# Patient Record
Sex: Male | Born: 1965 | Race: Black or African American | Hispanic: No | Marital: Single | Smoking: Never smoker
Health system: Southern US, Community
[De-identification: ages and names within clinical notes are randomized; demographics above are authoritative.]

## PROBLEM LIST (undated history)

## (undated) DIAGNOSIS — C801 Malignant (primary) neoplasm, unspecified: Secondary | ICD-10-CM

## (undated) DIAGNOSIS — E785 Hyperlipidemia, unspecified: Secondary | ICD-10-CM

## (undated) DIAGNOSIS — K7290 Hepatic failure, unspecified without coma: Secondary | ICD-10-CM

## (undated) HISTORY — PX: OTHER SURGICAL HISTORY: SHX169

---

## 2005-05-04 ENCOUNTER — Emergency Department: Payer: Self-pay | Admitting: Emergency Medicine

## 2007-05-06 ENCOUNTER — Emergency Department: Payer: Self-pay | Admitting: Internal Medicine

## 2017-07-13 ENCOUNTER — Inpatient Hospital Stay
Admission: EM | Admit: 2017-07-13 | Discharge: 2017-07-17 | DRG: 378 | Disposition: A | Discharge status: Home / Self Care | Payer: BLUE CROSS/BLUE SHIELD | Attending: Internal Medicine | Admitting: Internal Medicine

## 2017-07-13 ENCOUNTER — Other Ambulatory Visit: Payer: Self-pay

## 2017-07-13 ENCOUNTER — Encounter: Payer: Self-pay | Admitting: Emergency Medicine

## 2017-07-13 ENCOUNTER — Emergency Department: Payer: BLUE CROSS/BLUE SHIELD

## 2017-07-13 DIAGNOSIS — K921 Melena: Principal | ICD-10-CM | POA: Diagnosis present

## 2017-07-13 DIAGNOSIS — R933 Abnormal findings on diagnostic imaging of other parts of digestive tract: Secondary | ICD-10-CM | POA: Diagnosis not present

## 2017-07-13 DIAGNOSIS — Z79899 Other long term (current) drug therapy: Secondary | ICD-10-CM

## 2017-07-13 DIAGNOSIS — N400 Enlarged prostate without lower urinary tract symptoms: Secondary | ICD-10-CM | POA: Diagnosis present

## 2017-07-13 DIAGNOSIS — Z885 Allergy status to narcotic agent status: Secondary | ICD-10-CM | POA: Diagnosis not present

## 2017-07-13 DIAGNOSIS — C801 Malignant (primary) neoplasm, unspecified: Secondary | ICD-10-CM | POA: Diagnosis not present

## 2017-07-13 DIAGNOSIS — Z809 Family history of malignant neoplasm, unspecified: Secondary | ICD-10-CM | POA: Diagnosis not present

## 2017-07-13 DIAGNOSIS — K317 Polyp of stomach and duodenum: Secondary | ICD-10-CM | POA: Diagnosis present

## 2017-07-13 DIAGNOSIS — D125 Benign neoplasm of sigmoid colon: Secondary | ICD-10-CM | POA: Diagnosis present

## 2017-07-13 DIAGNOSIS — R160 Hepatomegaly, not elsewhere classified: Secondary | ICD-10-CM | POA: Diagnosis present

## 2017-07-13 DIAGNOSIS — R634 Abnormal weight loss: Secondary | ICD-10-CM | POA: Diagnosis present

## 2017-07-13 DIAGNOSIS — D490 Neoplasm of unspecified behavior of digestive system: Secondary | ICD-10-CM

## 2017-07-13 DIAGNOSIS — D500 Iron deficiency anemia secondary to blood loss (chronic): Secondary | ICD-10-CM | POA: Diagnosis present

## 2017-07-13 DIAGNOSIS — E785 Hyperlipidemia, unspecified: Secondary | ICD-10-CM | POA: Diagnosis present

## 2017-07-13 DIAGNOSIS — Z6834 Body mass index (BMI) 34.0-34.9, adult: Secondary | ICD-10-CM | POA: Diagnosis not present

## 2017-07-13 DIAGNOSIS — Z807 Family history of other malignant neoplasms of lymphoid, hematopoietic and related tissues: Secondary | ICD-10-CM | POA: Diagnosis not present

## 2017-07-13 DIAGNOSIS — D62 Acute posthemorrhagic anemia: Secondary | ICD-10-CM | POA: Diagnosis present

## 2017-07-13 DIAGNOSIS — D649 Anemia, unspecified: Secondary | ICD-10-CM

## 2017-07-13 DIAGNOSIS — K922 Gastrointestinal hemorrhage, unspecified: Secondary | ICD-10-CM | POA: Diagnosis present

## 2017-07-13 DIAGNOSIS — C799 Secondary malignant neoplasm of unspecified site: Secondary | ICD-10-CM | POA: Diagnosis not present

## 2017-07-13 DIAGNOSIS — R932 Abnormal findings on diagnostic imaging of liver and biliary tract: Secondary | ICD-10-CM | POA: Diagnosis not present

## 2017-07-13 DIAGNOSIS — K635 Polyp of colon: Secondary | ICD-10-CM

## 2017-07-13 DIAGNOSIS — D509 Iron deficiency anemia, unspecified: Secondary | ICD-10-CM | POA: Diagnosis not present

## 2017-07-13 HISTORY — DX: Hyperlipidemia, unspecified: E78.5

## 2017-07-13 LAB — TROPONIN I

## 2017-07-13 LAB — BASIC METABOLIC PANEL
ANION GAP: 9 (ref 5–15)
BUN: 11 mg/dL (ref 6–20)
CALCIUM: 9 mg/dL (ref 8.9–10.3)
CO2: 22 mmol/L (ref 22–32)
Chloride: 104 mmol/L (ref 98–111)
Creatinine, Ser: 0.92 mg/dL (ref 0.61–1.24)
GFR calc non Af Amer: >60 mL/min (ref >60)
GLUCOSE: 92 mg/dL (ref 70–99)
Potassium: 3.8 mmol/L (ref 3.5–5.1)
Sodium: 135 mmol/L (ref 135–145)

## 2017-07-13 LAB — CBC
HCT: 29.8 % — ABNORMAL LOW (ref 40.0–52.0)
Hemoglobin: 9.8 g/dL — ABNORMAL LOW (ref 13.0–18.0)
MCH: 25.7 pg — AB (ref 26.0–34.0)
MCHC: 32.9 g/dL (ref 32.0–36.0)
MCV: 78.2 fL — AB (ref 80.0–100.0)
Platelets: 319 10*3/uL (ref 150–440)
RBC: 3.81 MIL/uL — AB (ref 4.40–5.90)
RDW: 18.2 % — ABNORMAL HIGH (ref 11.5–14.5)
WBC: 9.1 10*3/uL (ref 3.8–10.6)

## 2017-07-13 LAB — HEPATIC FUNCTION PANEL
ALK PHOS: 190 U/L — AB (ref 38–126)
ALT: 19 U/L (ref 0–44)
AST: 36 U/L (ref 15–41)
Albumin: 3.6 g/dL (ref 3.5–5.0)
BILIRUBIN TOTAL: 0.8 mg/dL (ref 0.3–1.2)
Bilirubin, Direct: 0.2 mg/dL (ref 0.0–0.2)
Indirect Bilirubin: 0.6 mg/dL (ref 0.3–0.9)
Total Protein: 8.3 g/dL — ABNORMAL HIGH (ref 6.5–8.1)

## 2017-07-13 LAB — LIPASE, BLOOD: Lipase: 32 U/L (ref 11–51)

## 2017-07-13 LAB — HEMOGLOBIN: HEMOGLOBIN: 9.9 g/dL — AB (ref 13.0–18.0)

## 2017-07-13 LAB — TYPE AND SCREEN
ABO/RH(D): B POS
Antibody Screen: NEGATIVE

## 2017-07-13 MED ORDER — SODIUM CHLORIDE 0.9 % IV SOLN
INTRAVENOUS | Status: DC
  Administered 2017-07-13 – 2017-07-14 (×2): via INTRAVENOUS

## 2017-07-13 MED ORDER — OXYCODONE HCL 5 MG PO TABS: 5.0000 mg | ORAL_TABLET | ORAL | Status: DC | PRN

## 2017-07-13 MED ORDER — ACETAMINOPHEN 650 MG RE SUPP: 650.0000 mg | Freq: Four times a day (QID) | RECTAL | Status: DC | PRN

## 2017-07-13 MED ORDER — ONDANSETRON HCL 4 MG/2ML IJ SOLN: 4.0000 mg | Freq: Four times a day (QID) | INTRAMUSCULAR | Status: DC | PRN

## 2017-07-13 MED ORDER — ACETAMINOPHEN 325 MG PO TABS
650.0000 mg | ORAL_TABLET | Freq: Four times a day (QID) | ORAL | Status: DC | PRN
  Administered 2017-07-13 – 2017-07-17 (×6): 650 mg via ORAL
  Filled 2017-07-13 (×6): qty 2

## 2017-07-13 MED ORDER — SODIUM CHLORIDE 0.9 % IV SOLN
8.0000 mg/h | INTRAVENOUS | Status: DC
  Administered 2017-07-14 – 2017-07-15 (×3): 8 mg/h via INTRAVENOUS
  Filled 2017-07-13 (×5): qty 80

## 2017-07-13 MED ORDER — PANTOPRAZOLE SODIUM 40 MG IV SOLR: 40.0000 mg | Freq: Two times a day (BID) | INTRAVENOUS | Status: DC

## 2017-07-13 MED ORDER — ONDANSETRON HCL 4 MG PO TABS: 4.0000 mg | ORAL_TABLET | Freq: Four times a day (QID) | ORAL | Status: DC | PRN

## 2017-07-13 MED ORDER — SODIUM CHLORIDE 0.9 % IV SOLN
80.0000 mg | Freq: Once | INTRAVENOUS | Status: AC
  Administered 2017-07-13: 80 mg via INTRAVENOUS
  Filled 2017-07-13: qty 80

## 2017-07-13 NOTE — ED Provider Notes (Signed)
[facility] Emergency Department Provider Note  ____________________________________________  Time seen: Approximately 4:18 PM  I have reviewed the triage vital signs and the nursing notes.   HISTORY  Chief Complaint Chest Pain   HPI Omar Young is a 52 y.o. male with a history of hyperlipidemia and anemia who presents for evaluation of chest pain.  Patient reports that the pain started yesterday at 8 PM, the pain is dull and pressure-like located in the center of his chest, constant, mild, nonradiating.  No shortness of breath, diaphoresis, nausea, vomiting, no abdominal pain, the pain does not radiate to the back.  Patient denies doing any strenuous exercise yesterday.  He denies any personal history of heart attacks.  His mother died of a heart attack in her mid 39s.  Patient reports that he does not smoke, does not drink, does not use NSAIDs, no drug use.  He denies melena or history of GI bleed, hematemesis, coffee-ground emesis.  He denies any dizziness.  The pain is not pleuritic in nature, no personal family history of blood clots, recent travel immobilization, leg pain or swelling, hemoptysis, exogenous hormones.  Patient's wife reports that he has had 30 pound weight loss over the last year.  No night sweats.  No history of cancer  Past Medical History:  Diagnosis Date  . Hyperlipemia     Patient Active Problem List   Diagnosis Date Noted  . GI bleed 07/13/2017    Past Surgical History:  Procedure Laterality Date  . none      Prior to Admission medications   Not on File    Allergies Codeine  Family History  Problem Relation Age of Onset  . Diabetes Mother   . Hypertension Father     Social History Social History   Tobacco Use  . Smoking status: Never Smoker  . Smokeless tobacco: Never Used  Substance Use Topics  . Alcohol use: Not Currently  . Drug use: Never    Review of Systems  Constitutional: Negative for fever. +  Unintentional weight loss Eyes: Negative for visual changes. ENT: Negative for sore throat. Neck: No neck pain  Cardiovascular: + chest pain. Respiratory: Negative for shortness of breath. Gastrointestinal: Negative for abdominal pain, vomiting or diarrhea. Genitourinary: Negative for dysuria. Musculoskeletal: Negative for back pain. Skin: Negative for rash. Neurological: Negative for headaches, weakness or numbness. Psych: No SI or HI  ____________________________________________   PHYSICAL EXAM:  VITAL SIGNS: ED Triage Vitals  Enc Vitals Group     BP 07/13/17 1333 136/72     Pulse Rate 07/13/17 1333 85     Resp 07/13/17 1333 16     Temp 07/13/17 1333 100 F (37.8 C)     Temp Source 07/13/17 1333 Oral     SpO2 07/13/17 1333 97 %     Weight 07/13/17 1331 240 lb (108.9 kg)     Height 07/13/17 1331 5\' 10"  (1.778 m)     Head Circumference --      Peak Flow --      Pain Score 07/13/17 1331 6     Pain Loc --      Pain Edu? --      Excl. in [place]? --     Constitutional: Alert and oriented. Well appearing and in no apparent distress. HEENT:      Head: Normocephalic and atraumatic.         Eyes: Conjunctivae are normal. Sclera is non-icteric.       Mouth/Throat:  Mucous membranes are moist.       Neck: Supple with no signs of meningismus. Cardiovascular: Regular rate and rhythm. No murmurs, gallops, or rubs. 2+ symmetrical distal pulses are present in all extremities. No JVD. Respiratory: Normal respiratory effort. Lungs are clear to auscultation bilaterally. No wheezes, crackles, or rhonchi.  Gastrointestinal: Soft, tenderness to palpation on the epigastric region, and non distended with positive bowel sounds. No rebound or guarding. Genitourinary: No CVA tenderness.  Rectal exam showing melena grossly guaiac positive. Musculoskeletal: Nontender with normal range of motion in all extremities. No edema, cyanosis, or erythema of extremities. Neurologic: Normal speech and language.  Face is symmetric. Moving all extremities. No gross focal neurologic deficits are appreciated. Skin: Skin is warm, dry and intact. No rash noted. Psychiatric: Mood and affect are normal. Speech and behavior are normal.  ____________________________________________   LABS (all labs ordered are listed, but only abnormal results are displayed)  Labs Reviewed  CBC - Abnormal; Notable for the following components:      Result Value   RBC 3.81 (*)    Hemoglobin 9.8 (*)    HCT 29.8 (*)    MCV 78.2 (*)    MCH 25.7 (*)    RDW 18.2 (*)    All other components within normal limits  HEPATIC FUNCTION PANEL - Abnormal; Notable for the following components:   Total Protein 8.3 (*)    Alkaline Phosphatase 190 (*)    All other components within normal limits  BASIC METABOLIC PANEL  TROPONIN I  LIPASE, BLOOD  HEMOGLOBIN  HEMOGLOBIN  TYPE AND SCREEN  TYPE AND SCREEN   ____________________________________________  EKG  ED ECG REPORT I, Rudene Re, the attending physician, personally viewed and interpreted this ECG.  Normal sinus rhythm, rate of 79, normal intervals, normal axis, no ST elevations or depressions.  No prior for comparison. ____________________________________________  RADIOLOGY  I have personally reviewed the images performed during this visit and I agree with the Radiologist's read.   Interpretation by Radiologist:  Dg Chest 2 View  Result Date: 07/13/2017 CLINICAL DATA:  Chest pain since last night. EXAM: CHEST - 2 VIEW COMPARISON:  None. FINDINGS: The heart size and mediastinal contours are within normal limits. Both lungs are clear. The visualized skeletal structures are unremarkable. IMPRESSION: No active cardiopulmonary disease. Electronically Signed   By: Abelardo Diesel M.D.   On: 07/13/2017 15:03     ____________________________________________   PROCEDURES  Procedure(s) performed: None Procedures Critical Care performed:   None ____________________________________________   INITIAL IMPRESSION / ASSESSMENT AND PLAN / ED COURSE   52 y.o. male with a history of hyperlipidemia and anemia who presents for evaluation of chest pain.  Pain localizes to palpation of the epigastric region.  That together with a dropping hemoglobin may be concern for peptic ulcer disease versus gastritis.  A rectal exam was done which shows melena that is guaiac positive.  Patient was started on Protonix bolus and drip.  Type and screen was sent.  Plan for admission to trend hemoglobin and GI consult for an endoscopy.  I am also concerned for possible malignancy with unintentional weight loss.  Therefore patient will need endoscopy and colonoscopy.  His EKG and troponin did not show any acute ischemic findings.  At this time there is no indication for blood transfusion.      As part of my medical decision making, I reviewed the following data within the [place] History obtained from family, Nursing notes reviewed and  incorporated, Labs reviewed , EKG interpreted , Old chart reviewed, Radiograph reviewed , Discussed with admitting physician , Notes from prior ED visits and [place] Controlled Substance Database    Pertinent labs & imaging results that were available during my care of the patient were reviewed by me and considered in my medical decision making (see chart for details).    ____________________________________________   FINAL CLINICAL IMPRESSION(S) / ED DIAGNOSES  Final diagnoses:  UGIB (upper gastrointestinal bleed)  Anemia, unspecified type      NEW MEDICATIONS STARTED DURING THIS VISIT:  ED Discharge Orders    None       Note:  This document was prepared using Dragon voice recognition software and may include unintentional dictation errors.    Alfred Levins, [place], MD 07/13/17 [phone]

## 2017-07-13 NOTE — ED Notes (Signed)
Lab notified to add on Hepatic Function and Lipase.

## 2017-07-13 NOTE — H&P (Signed)
[place] [facility] [place] NAME: Omar Young    MR#:  [id number]  DATE OF BIRTH:  May 30, 1965  DATE OF ADMISSION:  07/13/2017  PRIMARY CARE PHYSICIAN: No primary care provider on file.   REQUESTING/REFERRING PHYSICIAN: [name] MD  CHIEF COMPLAINT:   Chief Complaint  Patient presents with  . Chest Pain    HISTORY OF PRESENT ILLNESS: Omar Young  is a 52 y.o. male with a known history of hyperlipidemia Who is presenting to the emergency room with complaint of pain in the lower part of his chest in the substernal region.  He states the symptoms started yesterday.  He describes it as a dull aching type of pain.  No associated nausea or vomiting.  The ED physician checked his stool and he was melanotic.  Patient has not noticed his stools to be dark.  He states he does not check.  Patient had a hemoglobin checked last year which was normal.  Currently he is not anemic with microcytosis.  He denies taking any NSAIDs.  Denies any history of gastric or peptic ulcer disease   PAST MEDICAL HISTORY:   Past Medical History:  Diagnosis Date  . Hyperlipemia     PAST SURGICAL HISTORY:  Past Surgical History:  Procedure Laterality Date  . none      SOCIAL HISTORY:  Social History   Tobacco Use  . Smoking status: Never Smoker  . Smokeless tobacco: Never Used  Substance Use Topics  . Alcohol use: Not Currently    FAMILY HISTORY:  Family History  Problem Relation Age of Onset  . Diabetes Mother   . Hypertension Father     DRUG ALLERGIES:  Allergies  Allergen Reactions  . Codeine     Headache     REVIEW OF SYSTEMS:   CONSTITUTIONAL: No fever, fatigue or weakness.  EYES: No blurred or double vision.  EARS, NOSE, AND THROAT: No tinnitus or ear pain.  RESPIRATORY: No cough, shortness of breath, wheezing or hemoptysis.  CARDIOVASCULAR: No chest pain, orthopnea, edema.  GASTROINTESTINAL: No nausea, vomiting, diarrhea or positive  abdominal pain.  GENITOURINARY: No dysuria, hematuria.  ENDOCRINE: No polyuria, nocturia,  HEMATOLOGY: No anemia, easy bruising or bleeding SKIN: No rash or lesion. MUSCULOSKELETAL: No joint pain or arthritis.   NEUROLOGIC: No tingling, numbness, weakness.  PSYCHIATRY: No anxiety or depression.   MEDICATIONS AT HOME:  Prior to Admission medications   Not on File      PHYSICAL EXAMINATION:   VITAL SIGNS: Blood pressure 136/72, pulse 85, temperature 100 F (37.8 C), temperature source Oral, resp. rate 16, height 5\' 10"  (1.778 m), weight 108.9 kg (240 lb), SpO2 97 %.  GENERAL:  52 y.o.-year-old patient lying in the bed with no acute distress.  EYES: Pupils equal, round, reactive to light and accommodation. No scleral icterus. Extraocular muscles intact.  HEENT: Head atraumatic, normocephalic. Oropharynx and nasopharynx clear.  NECK:  Supple, no jugular venous distention. No thyroid enlargement, no tenderness.  LUNGS: Normal breath sounds bilaterally, no wheezing, rales,rhonchi or crepitation. No use of accessory muscles of respiration.  CARDIOVASCULAR: S1, S2 normal. No murmurs, rubs, or gallops.  ABDOMEN: Soft, nontender, nondistended. Bowel sounds present. No organomegaly or mass.  EXTREMITIES: No pedal edema, cyanosis, or clubbing.  NEUROLOGIC: Cranial nerves II through XII are intact. Muscle strength 5/5 in all extremities. Sensation intact. Gait not checked.  PSYCHIATRIC: The patient is alert and oriented x 3.  SKIN: No obvious rash, lesion, or  ulcer.   LABORATORY PANEL:   CBC Recent Labs  Lab 07/13/17 1335  WBC 9.1  HGB 9.8*  HCT 29.8*  PLT 319  MCV 78.2*  MCH 25.7*  MCHC 32.9  RDW 18.2*   ------------------------------------------------------------------------------------------------------------------  Chemistries  Recent Labs  Lab 07/13/17 1335  NA 135  K 3.8  CL 104  CO2 22  GLUCOSE 92  BUN 11  CREATININE 0.92  CALCIUM 9.0  AST 36  ALT 19  ALKPHOS  190*  BILITOT 0.8   ------------------------------------------------------------------------------------------------------------------ estimated creatinine clearance is 117.4 mL/min (by C-G formula based on SCr of 0.92 mg/dL). ------------------------------------------------------------------------------------------------------------------ No results for input(s): TSH, T4TOTAL, T3FREE, THYROIDAB in the last 72 hours.  Invalid input(s): FREET3   Coagulation profile No results for input(s): INR, PROTIME in the last 168 hours. ------------------------------------------------------------------------------------------------------------------- No results for input(s): DDIMER in the last 72 hours. -------------------------------------------------------------------------------------------------------------------  Cardiac Enzymes Recent Labs  Lab 07/13/17 1335  TROPONINI <0.03   ------------------------------------------------------------------------------------------------------------------ Invalid input(s): POCBNP  ---------------------------------------------------------------------------------------------------------------  Urinalysis No results found for: COLORURINE, APPEARANCEUR, LABSPEC, PHURINE, GLUCOSEU, HGBUR, BILIRUBINUR, KETONESUR, PROTEINUR, UROBILINOGEN, NITRITE, LEUKOCYTESUR   RADIOLOGY: Dg Chest 2 View  Result Date: 07/13/2017 CLINICAL DATA:  Chest pain since last night. EXAM: CHEST - 2 VIEW COMPARISON:  None. FINDINGS: The heart size and mediastinal contours are within normal limits. Both lungs are clear. The visualized skeletal structures are unremarkable. IMPRESSION: No active cardiopulmonary disease. Electronically Signed   By: Abelardo Diesel M.D.   On: 07/13/2017 15:03    EKG: Orders placed or performed during the hospital encounter of 07/13/17  . EKG 12-Lead  . EKG 12-Lead  . ED EKG within 10 minutes  . ED EKG within 10 minutes    IMPRESSION AND  PLAN: Patient is 52 year old presenting with epigastric pain  1.  Upper GI bleed differential including esophagitis, gastritis, gastric ulcer or duodenal ulcer.  We will give him IV fluids follow hemoglobin.  Currently there is no indication for transfusion Continue IV Protonix Follow hemoglobin every 8 hours transfuse if below 7 I have consulted GI awaiting a callback Clear liquid diet for now N.p.o. past midnight  2.hyperlipidemia we will hold his home medication for now  3.  Miscellaneous SCDs for DVT prophylaxis   All the records are reviewed and case discussed with ED provider. Management plans discussed with the patient, family and they are in agreement.  CODE STATUS: Full code    TOTAL TIME TAKING CARE OF THIS PATIENT: 55 minutes.    Dustin Flock M.D on 07/13/2017 at 3:56 PM  Between 7am to 6pm - Pager - [phone]  After 6pm go to [email or web address] - password [email or web address]  [employer] Office  [phone]  CC: Primary care physician; No primary care provider on file.

## 2017-07-13 NOTE — ED Triage Notes (Signed)
Pt arrived with complaints of mid sternum chest pain that started last night. Pt describes the pain as a soreness. Pt denies any shortness of breath or radiation of pain. Pt in NAD

## 2017-07-13 NOTE — H&P (View-Only) (Signed)
[facility] GI Inpatient Consult Note   Kathline Magic, M.D.  Reason for Consult: Melena,    Attending Requesting Consult: Dustin Flock, M.D.  Outpatient Primary Physician: Meriel Flavors, M.D.  History of Present Illness: Omar Young is a 52 y.o. male with a history of hyperlipidemia who has had progressive weight loss over the last 8 months of about 35 pounds along with recent onset of early satiety for the past 3 months. The patient remarks and epigastric discomfort yesterday which did not resolve as on previous occasions and his wife brought to the emergency room. He underwent blood work which showed a mild anemia with hemoglobin of of 9.9. Rectal exam revealed melenic stool that was grossly heme positive. Patient denies nausea, vomiting, diarrheaor hematochezia. He is nave to both endoscopy and colonoscopy.  Past Medical History:  Past Medical History:  Diagnosis Date  . Hyperlipemia     Problem List: Patient Active Problem List   Diagnosis Date Noted  . GI bleed 07/13/2017    Past Surgical History: Past Surgical History:  Procedure Laterality Date  . none      Allergies: Allergies  Allergen Reactions  . Codeine     Headache     Home Medications: No medications prior to admission.   Home medication reconciliation was completed with the patient.   Scheduled Inpatient Medications:   . [START ON 07/17/2017] pantoprazole  40 mg Intravenous Q12H    Continuous Inpatient Infusions:   . sodium chloride 125 mL/hr at 07/13/17 1811  . pantoprozole (PROTONIX) infusion 8 mg/hr (07/13/17 1653)    PRN Inpatient Medications:  acetaminophen **OR** acetaminophen, ondansetron **OR** ondansetron (ZOFRAN) IV, oxyCODONE  Family History: family history includes Diabetes in his mother; Hypertension in his father.   GI Family History: negative  Social History:   reports that he has never smoked. He has never used smokeless tobacco. He reports that he drank  alcohol. He reports that he does not use drugs. The patient denies ETOH, tobacco, or drug use.    Review of Systems: Review of Systems - Negative except that in history of present illness.  Physical Examination: BP 130/86 (BP Location: Right Arm)   Pulse 71   Temp 100.3 F (37.9 C) (Oral)   Resp 16   Ht 5\' 10"  (1.778 m)   Wt 108.9 kg (240 lb)   SpO2 98%   BMI 34.44 kg/m  Physical Exam  Data: Lab Results  Component Value Date   WBC 9.1 07/13/2017   HGB 9.9 (L) 07/13/2017   HCT 29.8 (L) 07/13/2017   MCV 78.2 (L) 07/13/2017   PLT 319 07/13/2017   Recent Labs  Lab 07/13/17 1335 07/13/17 1722  HGB 9.8* 9.9*   Lab Results  Component Value Date   NA 135 07/13/2017   K 3.8 07/13/2017   CL 104 07/13/2017   CO2 22 07/13/2017   BUN 11 07/13/2017   CREATININE 0.92 07/13/2017   Lab Results  Component Value Date   ALT 19 07/13/2017   AST 36 07/13/2017   ALKPHOS 190 (H) 07/13/2017   BILITOT 0.8 07/13/2017   No results for input(s): APTT, INR, PTT in the last 168 hours. CBC Latest Ref Rng & Units 07/13/2017 07/13/2017  WBC 3.8 - 10.6 K/uL - 9.1  Hemoglobin 13.0 - 18.0 g/dL 9.9(L) 9.8(L)  Hematocrit 40.0 - 52.0 % - 29.8(L)  Platelets 150 - 440 K/uL - 319    STUDIES: Dg Chest 2 View  Result Date: 07/13/2017 CLINICAL DATA:  Chest pain since last night. EXAM: CHEST - 2 VIEW COMPARISON:  None. FINDINGS: The heart size and mediastinal contours are within normal limits. Both lungs are clear. The visualized skeletal structures are unremarkable. IMPRESSION: No active cardiopulmonary disease. Electronically Signed   By: Abelardo Diesel M.D.   On: 07/13/2017 15:03   @IMAGES @  Assessment: 1. Anemia secondary to gastrointestinal blood loss. 2. Melena suggestive of upper GI source. 3. Profound weight loss. Differential diagnoses includes chronic peptic ulcer disease, gastric malignancy, H. Pylori gastritis. Symptoms are very concerning for malignancy given constellation of  symptoms.  Recommendations: 1. Continue monitoring hemoglobin. 2. Continue IV Protonix. 3. Okay with by mouth clear liquids. 4. EGD in a.m. Patient is end-stage apply procedure indications, risks, alternatives and potential complications including but not limited to damage to internal organs, infection, bleeding, perforation and oversedation. Patient wishes to proceed. 5. Further recommends follow.  Thank you for the consult. Please call with questions or concerns.  Olean Ree, "Lanny Hurst MD [facility] Gastroenterology [place], [place] [address] [phone]  07/13/2017 7:24 PM

## 2017-07-13 NOTE — ED Notes (Signed)
Hospitalist to bedside.

## 2017-07-13 NOTE — ED Notes (Signed)
Attempted to call report, RN unavailable at this time.

## 2017-07-13 NOTE — Consult Note (Signed)
[facility] GI Inpatient Consult Note   Kathline Magic, M.D.  Reason for Consult: Melena,    Attending Requesting Consult: Dustin Flock, M.D.  Outpatient Primary Physician: Meriel Flavors, M.D.  History of Present Illness: Omar Young is a 52 y.o. male with a history of hyperlipidemia who has had progressive weight loss over the last 8 months of about 35 pounds along with recent onset of early satiety for the past 3 months. The patient remarks and epigastric discomfort yesterday which did not resolve as on previous occasions and his wife brought to the emergency room. He underwent blood work which showed a mild anemia with hemoglobin of of 9.9. Rectal exam revealed melenic stool that was grossly heme positive. Patient denies nausea, vomiting, diarrheaor hematochezia. He is nave to both endoscopy and colonoscopy.  Past Medical History:  Past Medical History:  Diagnosis Date  . Hyperlipemia     Problem List: Patient Active Problem List   Diagnosis Date Noted  . GI bleed 07/13/2017    Past Surgical History: Past Surgical History:  Procedure Laterality Date  . none      Allergies: Allergies  Allergen Reactions  . Codeine     Headache     Home Medications: No medications prior to admission.   Home medication reconciliation was completed with the patient.   Scheduled Inpatient Medications:   . [START ON 07/17/2017] pantoprazole  40 mg Intravenous Q12H    Continuous Inpatient Infusions:   . sodium chloride 125 mL/hr at 07/13/17 1811  . pantoprozole (PROTONIX) infusion 8 mg/hr (07/13/17 1653)    PRN Inpatient Medications:  acetaminophen **OR** acetaminophen, ondansetron **OR** ondansetron (ZOFRAN) IV, oxyCODONE  Family History: family history includes Diabetes in his mother; Hypertension in his father.   GI Family History: negative  Social History:   reports that he has never smoked. He has never used smokeless tobacco. He reports that he drank  alcohol. He reports that he does not use drugs. The patient denies ETOH, tobacco, or drug use.    Review of Systems: Review of Systems - Negative except that in history of present illness.  Physical Examination: BP 130/86 (BP Location: Right Arm)   Pulse 71   Temp 100.3 F (37.9 C) (Oral)   Resp 16   Ht 5\' 10"  (1.778 m)   Wt 108.9 kg (240 lb)   SpO2 98%   BMI 34.44 kg/m  Physical Exam  Data: Lab Results  Component Value Date   WBC 9.1 07/13/2017   HGB 9.9 (L) 07/13/2017   HCT 29.8 (L) 07/13/2017   MCV 78.2 (L) 07/13/2017   PLT 319 07/13/2017   Recent Labs  Lab 07/13/17 1335 07/13/17 1722  HGB 9.8* 9.9*   Lab Results  Component Value Date   NA 135 07/13/2017   K 3.8 07/13/2017   CL 104 07/13/2017   CO2 22 07/13/2017   BUN 11 07/13/2017   CREATININE 0.92 07/13/2017   Lab Results  Component Value Date   ALT 19 07/13/2017   AST 36 07/13/2017   ALKPHOS 190 (H) 07/13/2017   BILITOT 0.8 07/13/2017   No results for input(s): APTT, INR, PTT in the last 168 hours. CBC Latest Ref Rng & Units 07/13/2017 07/13/2017  WBC 3.8 - 10.6 K/uL - 9.1  Hemoglobin 13.0 - 18.0 g/dL 9.9(L) 9.8(L)  Hematocrit 40.0 - 52.0 % - 29.8(L)  Platelets 150 - 440 K/uL - 319    STUDIES: Dg Chest 2 View  Result Date: 07/13/2017 CLINICAL DATA:  Chest pain since last night. EXAM: CHEST - 2 VIEW COMPARISON:  None. FINDINGS: The heart size and mediastinal contours are within normal limits. Both lungs are clear. The visualized skeletal structures are unremarkable. IMPRESSION: No active cardiopulmonary disease. Electronically Signed   By: Abelardo Diesel M.D.   On: 07/13/2017 15:03   @IMAGES @  Assessment: 1. Anemia secondary to gastrointestinal blood loss. 2. Melena suggestive of upper GI source. 3. Profound weight loss. Differential diagnoses includes chronic peptic ulcer disease, gastric malignancy, H. Pylori gastritis. Symptoms are very concerning for malignancy given constellation of  symptoms.  Recommendations: 1. Continue monitoring hemoglobin. 2. Continue IV Protonix. 3. Okay with by mouth clear liquids. 4. EGD in a.m. Patient is end-stage apply procedure indications, risks, alternatives and potential complications including but not limited to damage to internal organs, infection, bleeding, perforation and oversedation. Patient wishes to proceed. 5. Further recommends follow.  Thank you for the consult. Please call with questions or concerns.  Olean Ree, "Lanny Hurst MD [facility] Gastroenterology [place], [place] [address] [phone]  07/13/2017 7:24 PM

## 2017-07-14 ENCOUNTER — Encounter: Payer: Self-pay | Admitting: Anesthesiology

## 2017-07-14 ENCOUNTER — Encounter
Admission: EM | Disposition: A | Discharge status: Home / Self Care | Payer: Self-pay | Source: Home / Self Care | Attending: Internal Medicine

## 2017-07-14 ENCOUNTER — Inpatient Hospital Stay: Payer: BLUE CROSS/BLUE SHIELD | Admitting: Anesthesiology

## 2017-07-14 ENCOUNTER — Inpatient Hospital Stay: Payer: BLUE CROSS/BLUE SHIELD

## 2017-07-14 HISTORY — PX: ESOPHAGOGASTRODUODENOSCOPY (EGD) WITH PROPOFOL: SHX5813

## 2017-07-14 LAB — BASIC METABOLIC PANEL
Anion gap: 7 (ref 5–15)
BUN: 10 mg/dL (ref 6–20)
CO2: 22 mmol/L (ref 22–32)
CREATININE: 0.8 mg/dL (ref 0.61–1.24)
Calcium: 8.5 mg/dL — ABNORMAL LOW (ref 8.9–10.3)
Chloride: 106 mmol/L (ref 98–111)
Glucose, Bld: 77 mg/dL (ref 70–99)
POTASSIUM: 3.7 mmol/L (ref 3.5–5.1)
SODIUM: 135 mmol/L (ref 135–145)

## 2017-07-14 LAB — CBC
HCT: 27.1 % — ABNORMAL LOW (ref 40.0–52.0)
Hemoglobin: 8.9 g/dL — ABNORMAL LOW (ref 13.0–18.0)
MCH: 25.5 pg — ABNORMAL LOW (ref 26.0–34.0)
MCHC: 32.7 g/dL (ref 32.0–36.0)
MCV: 77.9 fL — ABNORMAL LOW (ref 80.0–100.0)
Platelets: 259 10*3/uL (ref 150–440)
RBC: 3.48 MIL/uL — ABNORMAL LOW (ref 4.40–5.90)
RDW: 17.7 % — ABNORMAL HIGH (ref 11.5–14.5)
WBC: 6.9 10*3/uL (ref 3.8–10.6)

## 2017-07-14 LAB — HEMOGLOBIN
HEMOGLOBIN: 9.7 g/dL — AB (ref 13.0–18.0)
Hemoglobin: 8.7 g/dL — ABNORMAL LOW (ref 13.0–18.0)
Hemoglobin: 9 g/dL — ABNORMAL LOW (ref 13.0–18.0)

## 2017-07-14 LAB — FERRITIN: Ferritin: 35 ng/mL (ref 24–336)

## 2017-07-14 SURGERY — ESOPHAGOGASTRODUODENOSCOPY (EGD) WITH PROPOFOL
Anesthesia: General

## 2017-07-14 MED ORDER — PROPOFOL 10 MG/ML IV BOLUS
INTRAVENOUS | Status: AC
  Filled 2017-07-14: qty 20

## 2017-07-14 MED ORDER — SODIUM CHLORIDE 0.9 % IV SOLN: INTRAVENOUS | Status: DC

## 2017-07-14 MED ORDER — PANTOPRAZOLE SODIUM 40 MG PO TBEC
40.0000 mg | DELAYED_RELEASE_TABLET | Freq: Two times a day (BID) | ORAL | Status: DC
  Administered 2017-07-17: 08:00:00 40 mg via ORAL
  Filled 2017-07-14 (×2): qty 1

## 2017-07-14 MED ORDER — MIDAZOLAM HCL 2 MG/2ML IJ SOLN
INTRAMUSCULAR | Status: AC
  Filled 2017-07-14: qty 2

## 2017-07-14 MED ORDER — PANTOPRAZOLE SODIUM 40 MG PO TBEC: 40.0000 mg | DELAYED_RELEASE_TABLET | Freq: Two times a day (BID) | ORAL | 0 refills | Status: AC

## 2017-07-14 MED ORDER — SODIUM CHLORIDE 0.9 % IV SOLN
INTRAVENOUS | Status: DC
  Administered 2017-07-14: 1000 mL via INTRAVENOUS

## 2017-07-14 MED ORDER — MIDAZOLAM HCL 2 MG/2ML IJ SOLN
INTRAMUSCULAR | Status: DC | PRN
  Administered 2017-07-14: 2 mg via INTRAVENOUS

## 2017-07-14 MED ORDER — IOPAMIDOL (ISOVUE-300) INJECTION 61%
15.0000 mL | INTRAVENOUS | Status: AC
  Administered 2017-07-14 (×2): 15 mL via ORAL

## 2017-07-14 MED ORDER — IOHEXOL 350 MG/ML SOLN: 75.0000 mL | Freq: Once | INTRAVENOUS | Status: DC | PRN

## 2017-07-14 MED ORDER — IOPAMIDOL (ISOVUE-300) INJECTION 61%
100.0000 mL | Freq: Once | INTRAVENOUS | Status: AC | PRN
  Administered 2017-07-14: 100 mL via INTRAVENOUS

## 2017-07-14 MED ORDER — ONDANSETRON HCL 4 MG PO TABS: 4.0000 mg | ORAL_TABLET | Freq: Four times a day (QID) | ORAL | 0 refills | Status: AC | PRN

## 2017-07-14 MED ORDER — SODIUM CHLORIDE 0.9 % IV SOLN
300.0000 mg | Freq: Once | INTRAVENOUS | Status: AC
  Administered 2017-07-14: 11:00:00 300 mg via INTRAVENOUS
  Filled 2017-07-14: qty 15

## 2017-07-14 MED ORDER — PROPOFOL 10 MG/ML IV BOLUS
INTRAVENOUS | Status: DC | PRN
  Administered 2017-07-14: 40 mg via INTRAVENOUS
  Administered 2017-07-14: 50 mg via INTRAVENOUS
  Administered 2017-07-14 (×7): 10 mg via INTRAVENOUS

## 2017-07-14 NOTE — Interval H&P Note (Signed)
History and Physical Interval Note:  07/14/2017 12:15 PM  Omar Young  has presented today for surgery, with the diagnosis of Anemia, melena, early satiety, weight loss  The various methods of treatment have been discussed with the patient and family. After consideration of risks, benefits and other options for treatment, the patient has consented to  Procedure(s): ESOPHAGOGASTRODUODENOSCOPY (EGD) WITH PROPOFOL (N/A) as a surgical intervention .  The patient's history has been reviewed, patient examined, no change in status, stable for surgery.  I have reviewed the patient's chart and labs.  Questions were answered to the patient's satisfaction.     [place], [place]

## 2017-07-14 NOTE — Anesthesia Postprocedure Evaluation (Signed)
Anesthesia Post Note  Patient: Omar Young  Procedure(s) Performed: ESOPHAGOGASTRODUODENOSCOPY (EGD) WITH PROPOFOL (N/A )  Patient location during evaluation: Endoscopy Anesthesia Type: General Level of consciousness: awake and alert Pain management: pain level controlled Vital Signs Assessment: post-procedure vital signs reviewed and stable Respiratory status: spontaneous breathing, nonlabored ventilation, respiratory function stable and patient connected to nasal cannula oxygen Cardiovascular status: blood pressure returned to baseline and stable Postop Assessment: no apparent nausea or vomiting Anesthetic complications: no     Last Vitals:  Vitals:   07/14/17 1454 07/14/17 1923  BP: 131/78 118/72  Pulse: 75 90  Resp: 20 18  Temp: 37.1 C 37.4 C  SpO2: 100% 99%    Last Pain:  Vitals:   07/14/17 1923  TempSrc: Oral  PainSc:                  Martha Clan

## 2017-07-14 NOTE — Progress Notes (Signed)
Patient ID: Omar Young, male   DOB: Sep 17, 1965, 52 y.o.   MRN: [id number]  [employer] [facility] PROGRESS NOTE  Omar Young IRS:[id number] DOB: [birth year] DOA: 07/13/2017 PCP: Katheren Shams  HPI/Subjective: Patient had some epigastric discomfort.  Some weight loss.  Found to have some black stool.  Objective: Vitals:   07/14/17 1356 07/14/17 1454  BP: 131/82 131/78  Pulse:  75  Resp:  20  Temp:  98.8 F (37.1 C)  SpO2:  100%    Filed Weights   07/13/17 1331 07/13/17 1554  Weight: 108.9 kg (240 lb) 108.9 kg (240 lb)    ROS: Review of Systems  Constitutional: Negative for chills and fever.  Eyes: Negative for blurred vision.  Respiratory: Negative for cough and shortness of breath.   Cardiovascular: Negative for chest pain.  Gastrointestinal: Positive for abdominal pain and melena. Negative for constipation, diarrhea, nausea and vomiting.  Genitourinary: Negative for dysuria.  Musculoskeletal: Negative for joint pain.  Neurological: Negative for dizziness and headaches.   Exam: Physical Exam  Constitutional: He is oriented to person, place, and time.  HENT:  Nose: No mucosal edema.  Mouth/Throat: No oropharyngeal exudate or posterior oropharyngeal edema.  Eyes: Pupils are equal, round, and reactive to light. Conjunctivae, EOM and lids are normal.  Neck: No JVD present. Carotid bruit is not present. No edema present. No thyroid mass and no thyromegaly present.  Cardiovascular: S1 normal and S2 normal. Exam reveals no gallop.  No murmur heard. Pulses:      Dorsalis pedis pulses are 2+ on the right side, and 2+ on the left side.  Respiratory: No respiratory distress. He has no wheezes. He has no rhonchi. He has no rales.  GI: Soft. Bowel sounds are normal. There is tenderness in the epigastric area.  Musculoskeletal:       Right ankle: He exhibits no swelling.       Left ankle: He exhibits no swelling.  Lymphadenopathy:    He has no cervical adenopathy.   Neurological: He is alert and oriented to person, place, and time. No cranial nerve deficit.  Skin: Skin is warm. No rash noted. Nails show no clubbing.  Psychiatric: He has a normal mood and affect.      Data Reviewed: Basic Metabolic Panel: Recent Labs  Lab 07/13/17 1335 07/14/17 0804  NA 135 135  K 3.8 3.7  CL 104 106  CO2 22 22  GLUCOSE 92 77  BUN 11 10  CREATININE 0.92 0.80  CALCIUM 9.0 8.5*   Liver Function Tests: Recent Labs  Lab 07/13/17 1335  AST 36  ALT 19  ALKPHOS 190*  BILITOT 0.8  PROT 8.3*  ALBUMIN 3.6   Recent Labs  Lab 07/13/17 1335  LIPASE 32   CBC: Recent Labs  Lab 07/13/17 1335 07/13/17 1722 07/14/17 0008 07/14/17 0804 07/14/17 1525  WBC 9.1  --   --  6.9  --   HGB 9.8* 9.9* 8.7* 8.9*  9.0* 9.7*  HCT 29.8*  --   --  27.1*  --   MCV 78.2*  --   --  77.9*  --   PLT 319  --   --  259  --    Cardiac Enzymes: Recent Labs  Lab 07/13/17 1335  TROPONINI <0.03    Studies: Dg Chest 2 View  Result Date: 07/13/2017 CLINICAL DATA:  Chest pain since last night. EXAM: CHEST - 2 VIEW COMPARISON:  None. FINDINGS: The heart size and mediastinal contours are within normal limits. Both  lungs are clear. The visualized skeletal structures are unremarkable. IMPRESSION: No active cardiopulmonary disease. Electronically Signed   By: Abelardo Diesel M.D.   On: 07/13/2017 15:03    Scheduled Meds: . [START ON 07/15/2017] pantoprazole  40 mg Oral BID   Continuous Infusions: . pantoprozole (PROTONIX) infusion 8 mg/hr (07/14/17 0240)    Assessment/Plan:  1. Acute blood loss anemia, epigastric pain and weight loss.  EGD showing congestive mucosa in the gastric body and multiple duodenal polyps.  GI ordered a CT scan of the abdomen and pelvis for further evaluation.  Depending on these results the patient may end up being able to go home this evening.  Patient was initially placed on Protonix drip and will be placed on Protonix upon going home.  Patient was  given IV Venefer for also.  Follow-up biopsies from EGD. 2. Hyperlipidemia unspecified can go back on his medication at home.  Code Status:     Code Status Orders  (From admission, onward)        Start     Ordered   07/13/17 1737  Full code  Continuous     07/13/17 1736    Code Status History    This patient has a current code status but no historical code status.     Family Communication: Spoke with family at the bedside Disposition Plan: Potential disposition after CT scan depending on results  Consultants:  Gastroenterology  Procedures:  Endoscopy  Time spent: 32 minutes  [place]

## 2017-07-14 NOTE — Anesthesia Post-op Follow-up Note (Signed)
Anesthesia QCDR form completed.        

## 2017-07-14 NOTE — Progress Notes (Signed)
Made Dr. Leslye Peer aware of CT results. Will continue to monitor.  Kyla Balzarine, LPN

## 2017-07-14 NOTE — Op Note (Signed)
[facility] Gastroenterology Patient Name: Omar Young Procedure Date: 07/14/2017 12:56 PM MRN: [id number] Account #: [id number] Date of Birth: [birth year] Admit Type: Inpatient Age: 52 Room: [facility] ENDO ROOM 3 Gender: Male Note Status: Finalized Procedure:            Upper GI endoscopy Indications:          Epigastric abdominal pain, Weight loss, Early Satiety Providers:            Benay Pike. Alice Reichert MD, MD Referring MD:         Lafonda Mosses. Patel MD (Referring MD) Medicines:            Propofol per Anesthesia Complications:        No immediate complications. Procedure:            Pre-Anesthesia Assessment:                       - The risks and benefits of the procedure and the                        sedation options and risks were discussed with the                        patient. All questions were answered and informed                        consent was obtained.                       - Patient identification and proposed procedure were                        verified prior to the procedure by the nurse. The                        procedure was verified in the procedure room.                       - ASA Grade Assessment: II - A patient with mild                        systemic disease.                       - After reviewing the risks and benefits, the patient                        was deemed in satisfactory condition to undergo the                        procedure.                       After obtaining informed consent, the endoscope was                        passed under direct vision. Throughout the procedure,                        the patient's blood pressure, pulse, and oxygen  saturations were monitored continuously. The Endoscope                        was introduced through the mouth, and advanced to the                        third part of duodenum. The upper GI endoscopy was                        accomplished without  difficulty. The patient tolerated                        the procedure fairly well. Findings:      The examined esophagus was normal.      Segmental moderate mucosal changes characterized by congestion were       found in the gastric body. Biopsies were taken with a cold forceps for       histology.      Multiple 1 to 3 mm sessile polyps with no bleeding were found in the       first portion of the duodenum. Biopsies were taken with a cold forceps       for histology.      The exam was otherwise without abnormality. Impression:           - Normal esophagus.                       - Congested mucosa in the gastric body. Biopsied.                       - Multiple duodenal polyps. See Findings for details.                        Biopsied.                       - The examination was otherwise normal.                       - Ddx includes lymphoma, benign brunner's glands,                        lymphoid hyperplasia, etc. Recommendation:       - Return patient to hospital ward for possible                        discharge same day.                       - CT abdomen/pelvis w contrast                       - The findings and recommendations were discussed with                        the patient and their family. Procedure Code(s):    --- Professional ---                       [phone], Esophagogastroduodenoscopy, flexible, transoral;                        with biopsy, single  or multiple Diagnosis Code(s):    --- Professional ---                       R63.4, Abnormal weight loss                       R10.13, Epigastric pain                       K31.7, Polyp of stomach and duodenum                       K31.89, Other diseases of stomach and duodenum CPT copyright 2017 [employer]. All rights reserved. The codes documented in this report are preliminary and upon coder review may  be revised to meet current compliance requirements. Efrain Sella MD, MD 07/14/2017 1:21:04  PM This report has been signed electronically. Number of Addenda: 0 Note Initiated On: 07/14/2017 12:56 PM      [facility]

## 2017-07-14 NOTE — Transfer of Care (Signed)
Immediate Anesthesia Transfer of Care Note  Patient: Omar Young  Procedure(s) Performed: ESOPHAGOGASTRODUODENOSCOPY (EGD) WITH PROPOFOL (N/A )  Patient Location: PACU  Anesthesia Type:General  Level of Consciousness: drowsy  Airway & Oxygen Therapy: Patient Spontanous Breathing and Patient connected to nasal cannula oxygen  Post-op Assessment: Report given to RN and Post -op Vital signs reviewed and stable  Post vital signs: Reviewed and stable  Last Vitals:  Vitals Value Taken Time  BP 104/63 07/14/2017  1:16 PM  Temp 36.5 C 07/14/2017  1:18 PM  Pulse 74 07/14/2017  1:18 PM  Resp 27 07/14/2017  1:18 PM  SpO2 97 % 07/14/2017  1:18 PM  Vitals shown include unvalidated device data.  Last Pain:  Vitals:   07/14/17 1318  TempSrc: Tympanic  PainSc:          Complications: No apparent anesthesia complications

## 2017-07-14 NOTE — Anesthesia Procedure Notes (Signed)
Procedure Name: MAC Date/Time: 07/14/2017 1:00 PM Performed by: Rudean Hitt, CRNA Pre-anesthesia Checklist: Patient identified, Emergency Drugs available, Suction available, Patient being monitored and Timeout performed Patient Re-evaluated:Patient Re-evaluated prior to induction Induction Type: IV induction

## 2017-07-14 NOTE — Anesthesia Preprocedure Evaluation (Signed)
Anesthesia Evaluation  Patient identified by MRN, date of birth, ID band Patient awake    Reviewed: Allergy & Precautions, H&P , NPO status , Patient's Chart, lab work & pertinent test results, reviewed documented beta blocker date and time   History of Anesthesia Complications Negative for: history of anesthetic complications  Airway Mallampati: III  TM Distance: >3 FB Neck ROM: full    Dental  (+) Dental Advidsory Given, Teeth Intact, Missing   Pulmonary neg pulmonary ROS,           Cardiovascular Exercise Tolerance: Good negative cardio ROS       Neuro/Psych negative neurological ROS  negative psych ROS   GI/Hepatic negative GI ROS, Neg liver ROS,   Endo/Other  negative endocrine ROS  Renal/GU negative Renal ROS  negative genitourinary   Musculoskeletal   Abdominal   Peds  Hematology negative hematology ROS (+)   Anesthesia Other Findings Past Medical History: No date: Hyperlipemia   Reproductive/Obstetrics negative OB ROS                             Anesthesia Physical Anesthesia Plan  ASA: II  Anesthesia Plan: General   Post-op Pain Management:    Induction: Intravenous  PONV Risk Score and Plan: 2 and Propofol infusion  Airway Management Planned: Nasal Cannula  Additional Equipment:   Intra-op Plan:   Post-operative Plan:   Informed Consent: I have reviewed the patients History and Physical, chart, labs and discussed the procedure including the risks, benefits and alternatives for the proposed anesthesia with the patient or authorized representative who has indicated his/her understanding and acceptance.   Dental Advisory Given  Plan Discussed with: Anesthesiologist, CRNA and Surgeon  Anesthesia Plan Comments:         Anesthesia Quick Evaluation

## 2017-07-15 ENCOUNTER — Encounter: Payer: Self-pay | Admitting: Internal Medicine

## 2017-07-15 DIAGNOSIS — Z807 Family history of other malignant neoplasms of lymphoid, hematopoietic and related tissues: Secondary | ICD-10-CM

## 2017-07-15 DIAGNOSIS — D509 Iron deficiency anemia, unspecified: Secondary | ICD-10-CM

## 2017-07-15 DIAGNOSIS — Z6834 Body mass index (BMI) 34.0-34.9, adult: Secondary | ICD-10-CM

## 2017-07-15 DIAGNOSIS — Z885 Allergy status to narcotic agent status: Secondary | ICD-10-CM

## 2017-07-15 DIAGNOSIS — R932 Abnormal findings on diagnostic imaging of liver and biliary tract: Secondary | ICD-10-CM

## 2017-07-15 DIAGNOSIS — R160 Hepatomegaly, not elsewhere classified: Secondary | ICD-10-CM

## 2017-07-15 DIAGNOSIS — C801 Malignant (primary) neoplasm, unspecified: Secondary | ICD-10-CM

## 2017-07-15 DIAGNOSIS — C799 Secondary malignant neoplasm of unspecified site: Secondary | ICD-10-CM

## 2017-07-15 DIAGNOSIS — R933 Abnormal findings on diagnostic imaging of other parts of digestive tract: Secondary | ICD-10-CM

## 2017-07-15 DIAGNOSIS — K922 Gastrointestinal hemorrhage, unspecified: Secondary | ICD-10-CM

## 2017-07-15 DIAGNOSIS — Z809 Family history of malignant neoplasm, unspecified: Secondary | ICD-10-CM

## 2017-07-15 LAB — BASIC METABOLIC PANEL
Anion gap: 9 (ref 5–15)
BUN: 10 mg/dL (ref 6–20)
CHLORIDE: 103 mmol/L (ref 98–111)
CO2: 23 mmol/L (ref 22–32)
Calcium: 8.8 mg/dL — ABNORMAL LOW (ref 8.9–10.3)
Creatinine, Ser: 0.82 mg/dL (ref 0.61–1.24)
GFR calc Af Amer: >60 mL/min (ref >60)
GFR calc non Af Amer: >60 mL/min (ref >60)
Glucose, Bld: 85 mg/dL (ref 70–99)
POTASSIUM: 3.5 mmol/L (ref 3.5–5.1)
Sodium: 135 mmol/L (ref 135–145)

## 2017-07-15 LAB — APTT: aPTT: 38 seconds — ABNORMAL HIGH (ref 24–36)

## 2017-07-15 LAB — HEMOGLOBIN: Hemoglobin: 8.8 g/dL — ABNORMAL LOW (ref 13.0–18.0)

## 2017-07-15 LAB — PROTIME-INR
INR: 1.27
Prothrombin Time: 15.8 seconds — ABNORMAL HIGH (ref 11.4–15.2)

## 2017-07-15 MED ORDER — PEG 3350-KCL-NA BICARB-NACL 420 G PO SOLR
4000.0000 mL | Freq: Once | ORAL | Status: AC
  Administered 2017-07-15: 4000 mL via ORAL
  Filled 2017-07-15: qty 4000

## 2017-07-15 NOTE — Progress Notes (Signed)
Patient ID: Omar Young, male   DOB: Oct 30, 1965, 52 y.o.   MRN: [id number]  ACP note   patient and family at the bedside   Diagnosis:  metastatic colon cancer to liver, lung and lymph nodes. Acute blood loss anemia.  Patient is a full code.  Plan.  First we need tissue biopsy to confirm diagnosis and see what this is.  Case discussed with oncology to follow-up biopsy report as outpatient.  Case discussed with Dr. Allen Norris gastroenterology to set up for colonoscopy tomorrow.  Time spent on ACP discussion 17 minutes Dr. Loletha Grayer

## 2017-07-15 NOTE — Plan of Care (Signed)
  Problem: Education: Goal: Knowledge of General Education information will improve Outcome: Progressing   Problem: Health Behavior/Discharge Planning: Goal: Ability to manage health-related needs will improve Outcome: Progressing   

## 2017-07-15 NOTE — Consult Note (Signed)
[facility]  Date of admission:  07/13/2017  Inpatient day:  07/15/2017  Consulting physician:  Dr. Caffie Damme.  Reason for Consultation:  Metastatic cancer  Chief Complaint: Omar Young is a 52 y.o. male who was admitted through the emergency room with with symptomatic anemia.  HPI: The patient notes a history of abdominal pain earlier this week.  He denied any nausea or vomiting.  He notes no melena or hematochezia, although comments that he "doesn't look".  He has lost 30 pounds in the past year unintentionally.  He has has never had a colonoscopy.  He denies any family history of colon cancer.  He presented to [facility] ER on 07/13/2017 with chest pain. Pain localized to the epigastric region.  EKG and CXR were negative.  Hemoglobin was 9.8 with an MCV of 78.2. Review of prior CBCs revealed a hemoglobin 12.5 and MCV 87.4 on 07/19/2016.  Stool was guaiac positive.  He was admitted with a presumed upper GI bleed.  He was seen by Dr. Olean Ree.  EGD on 07/14/2017 revealed a normal esophagus, congested mucosa in the gastric body, and multiple duodenal polyps. Biopsies were taken.  Pathology pending.    Abdomen and pelvic CT on 07/14/2017 revealed an irregular 5.5 cm annular wall thickening in the proximal sigmoid colon measuring compatible with primary sigmoid colon carcinoma.  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  There was bulky hepatic metastatic disease involving much of the liver.  There were > 5 enhancing liver lesions (17.8 x 13.2 cm centered in the left liver; 6.2 x 5.8 cm right liver dome and 4.3 x 3.6 cm posterior right liver).  There was a  solid 6 mm right lower lobe pulmonary nodule, cannot exclude pulmonary metastasis.  There was a 1.9 cm right pericardiophrenic adenopathy suspicious for mediastinal nodal metastasis. There was nonspecific mild diffuse  bladder wall thickening, probably due to chronic bladder outlet obstruction by the mildly enlarged prostate.  There was cholelithiasis.  He is scheduled for colonoscopy tomorrow with Dr. Lucilla Lame.  Symptomatically, he denies any complaints.  His father had lymphoma at age 3. His paternal grandfather had "cancer".   Past Medical History:  Diagnosis Date  . Hyperlipemia     Past Surgical History:  Procedure Laterality Date  . ESOPHAGOGASTRODUODENOSCOPY (EGD) WITH PROPOFOL N/A 07/14/2017   Procedure: ESOPHAGOGASTRODUODENOSCOPY (EGD) WITH PROPOFOL;  Surgeon: Toledo, Benay Pike, MD;  Location: [facility] ENDOSCOPY;  Service: Gastroenterology;  Laterality: N/A;  . none      Family History  Problem Relation Age of Onset  . Diabetes Mother   . Hypertension Father     Social History:  reports that he has never smoked. He has never used smokeless tobacco. He reports that he drank alcohol. He reports that he does not use drugs.  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  He lives in [place].  He is accompanied by his wife and sister.  Allergies:  Allergies  Allergen Reactions  . Codeine     Headache     No medications prior to admission.    Review of Systems: GENERAL:  Feels good. No fevers, sweats.  Weight loss of 30 pounds in 1 year. PERFORMANCE STATUS (ECOG):  1 HEENT:  No visual changes, runny nose, sore throat, mouth sores or tenderness. Lungs: No shortness of breath or cough.  No hemoptysis. Cardiac:  Resolution of chest pain.  No palpitations,  orthopnea, or PND. GI:  No nausea, vomiting, diarrhea, constipation, melena or hematochezia ("doesn't look"). GU:  No urgency, frequency, dysuria, or hematuria. Musculoskeletal:  No back pain.  No joint pain.  No muscle tenderness. Extremities:  No pain or swelling. Skin:  No rashes or skin changes. Neuro:  No headache, numbness or weakness, balance or coordination issues. Endocrine:  No diabetes, thyroid issues, hot flashes or night  sweats. Psych:  No mood changes, depression or anxiety. Pain:  No focal pain. Review of systems:  All other systems reviewed and found to be negative.  Physical Exam:  Blood pressure 119/81, pulse 74, temperature 98.9 F (37.2 C), temperature source Oral, resp. rate 18, height 5' 10"  (1.778 m), weight 240 lb (108.9 kg), SpO2 99 %.  GENERAL:  Well developed, well nourished, gentleman sitting comfortably on the medical unit in no acute distress. MENTAL STATUS:  Alert and oriented to person, place and time. HEAD:  Short dark hair.  Goatee.  Normocephalic, atraumatic, face symmetric, no Cushingoid features. EYES:  Brown eyes.  Pupils equal round and reactive to light and accomodation.  No conjunctivitis or scleral icterus. ENT:  Oropharynx clear without lesion.  Tongue normal. Mucous membranes moist.  RESPIRATORY:  Clear to auscultation without rales, wheezes or rhonchi. CARDIOVASCULAR:  Regular rate and rhythm without murmur, rub or gallop. ABDOMEN:  Soft, tender in the left lower quadrant without guarding or rebound tenderness.  Active bowel sounds, and no hepatosplenomegaly.  No masses. SKIN:  No rashes, ulcers or lesions. EXTREMITIES: No edema, no skin discoloration or tenderness.  No palpable cords. LYMPH NODES: No palpable cervical, supraclavicular, axillary or inguinal adenopathy  NEUROLOGICAL: Unremarkable. PSYCH:  Appropriate.   Results for orders placed or performed during the hospital encounter of 07/13/17 (from the past 48 hour(s))  Hemoglobin     Status: Abnormal   Collection Time: 07/14/17 12:08 AM  Result Value Ref Range   Hemoglobin 8.7 (L) 13.0 - 18.0 g/dL    Comment: Performed at [facility], [address] [place], [place] [address]  Ferritin     Status: None   Collection Time: 07/14/17 12:08 AM  Result Value Ref Range   Ferritin 35 24 - 336 ng/mL    Comment: Performed at [facility], [address] [place], [place] [address]  CBC     Status:  Abnormal   Collection Time: 07/14/17  8:04 AM  Result Value Ref Range   WBC 6.9 3.8 - 10.6 K/uL   RBC 3.48 (L) 4.40 - 5.90 MIL/uL   Hemoglobin 8.9 (L) 13.0 - 18.0 g/dL   HCT 27.1 (L) 40.0 - 52.0 %   MCV 77.9 (L) 80.0 - 100.0 fL   MCH 25.5 (L) 26.0 - 34.0 pg   MCHC 32.7 32.0 - 36.0 g/dL   RDW 17.7 (H) 11.5 - 14.5 %   Platelets 259 150 - 440 K/uL    Comment: Performed at [facility], [address], [place], [place] [address]  Basic metabolic panel     Status: Abnormal   Collection Time: 07/14/17  8:04 AM  Result Value Ref Range   Sodium 135 135 - 145 mmol/L   Potassium 3.7 3.5 - 5.1 mmol/L   Chloride 106 98 - 111 mmol/L    Comment: Please note change in reference range.   CO2 22 22 - 32 mmol/L   Glucose, Bld 77 70 - 99 mg/dL    Comment: Please note change in reference range.   BUN 10  6 - 20 mg/dL    Comment: Please note change in reference range.   Creatinine, Ser 0.80 0.61 - 1.24 mg/dL   Calcium 8.5 (L) 8.9 - 10.3 mg/dL   GFR calc non Af Amer >60 >60 mL/min   GFR calc Af Amer >60 >60 mL/min    Comment: (NOTE) The eGFR has been calculated using the CKD EPI equation. This calculation has not been validated in all clinical situations. eGFR's persistently <60 mL/min signify possible Chronic Kidney Disease.    Anion gap 7 5 - 15    Comment: Performed at [facility], [address] [place], [place] [address]  Hemoglobin     Status: Abnormal   Collection Time: 07/14/17  8:04 AM  Result Value Ref Range   Hemoglobin 9.0 (L) 13.0 - 18.0 g/dL    Comment: Performed at [facility], [address] [place], [place] [address]  Hemoglobin     Status: Abnormal   Collection Time: 07/14/17  3:25 PM  Result Value Ref Range   Hemoglobin 9.7 (L) 13.0 - 18.0 g/dL    Comment: Performed at [facility], [address] [place], [place] [address]  Hemoglobin     Status: Abnormal   Collection Time: 07/15/17  7:41 AM  Result Value Ref Range    Hemoglobin 8.8 (L) 13.0 - 18.0 g/dL    Comment: Performed at [facility], [address] [place], [place] [address]  Protime-INR     Status: Abnormal   Collection Time: 07/15/17  7:41 AM  Result Value Ref Range   Prothrombin Time 15.8 (H) 11.4 - 15.2 seconds   INR 1.27     Comment: Performed at [facility], [address] [place], [place] [address]  APTT     Status: Abnormal   Collection Time: 07/15/17  7:41 AM  Result Value Ref Range   aPTT 38 (H) 24 - 36 seconds    Comment:        IF BASELINE aPTT IS ELEVATED, SUGGEST PATIENT RISK ASSESSMENT BE USED TO DETERMINE APPROPRIATE ANTICOAGULANT THERAPY. Performed at [facility], [address] [place], [place] [address]   Basic metabolic panel     Status: Abnormal   Collection Time: 07/15/17  7:48 PM  Result Value Ref Range   Sodium 135 135 - 145 mmol/L   Potassium 3.5 3.5 - 5.1 mmol/L   Chloride 103 98 - 111 mmol/L    Comment: Please note change in reference range.   CO2 23 22 - 32 mmol/L   Glucose, Bld 85 70 - 99 mg/dL    Comment: Please note change in reference range.   BUN 10 6 - 20 mg/dL    Comment: Please note change in reference range.   Creatinine, Ser 0.82 0.61 - 1.24 mg/dL   Calcium 8.8 (L) 8.9 - 10.3 mg/dL   GFR calc non Af Amer >60 >60 mL/min   GFR calc Af Amer >60 >60 mL/min    Comment: (NOTE) The eGFR has been calculated using the CKD EPI equation. This calculation has not been validated in all clinical situations. eGFR's persistently <60 mL/min signify possible Chronic Kidney Disease.    Anion gap 9 5 - 15    Comment: Performed at [facility], [address] [place], [place] [address]   Ct Abdomen Pelvis W Contrast  Result Date: 07/14/2017 CLINICAL DATA:  Inpatient. Progressive weight loss of 35 pounds over the prior 8 months. Early satiety. Epigastric abdominal pain.  Anemia. EXAM: CT ABDOMEN AND PELVIS WITH CONTRAST TECHNIQUE: Multidetector CT imaging of the  abdomen and pelvis was performed using the standard protocol following bolus administration of intravenous contrast. CONTRAST:  144m ISOVUE-300 IOPAMIDOL (ISOVUE-300) INJECTION 61% COMPARISON:  None. FINDINGS: Lower chest: Right lower lobe 6 mm solid pulmonary nodule (series 4/image 9). Bandlike scarring versus atelectasis in dependent basilar right lower lobe. Enlarged 1.9 cm right pericardiophrenic node (series 7/image 5). Hepatobiliary: There are multiple (greater than 5) heterogeneously enhancing liver masses involving much of the liver. There is a dominant large 17.8 x 13.2 cm liver mass centered in the left liver lobe with involvement of right and left liver lobes (series 2/image 23), with central heterogeneous calcification. Additional representative 6.2 x 5.8 cm right liver dome (series 2/image 7) and posterior right liver lobe 4.3 x 3.6 cm (series 2/image 19) masses. Cholelithiasis. Contracted gallbladder. No definite gallbladder wall thickening. No pericholecystic fluid. There is intrahepatic biliary ductal dilatation in the left liver lobe peripheral to the dominant liver mass. Normal caliber common bile duct. Pancreas: Normal, with no mass or duct dilation. Spleen: Normal size. No mass. Adrenals/Urinary Tract: Normal adrenals. A few scattered subcentimeter hypodense renal cortical lesions in both kidneys are too small to characterize. No hydronephrosis. Nondistended bladder with suggestion of mild diffuse bladder wall thickening. Stomach/Bowel: Normal non-distended stomach. Normal caliber small bowel with no small bowel wall thickening. Normal appendix. Irregular annular wall thickening in the proximal sigmoid colon measuring 5.5 cm in length (series 2/image 63). No additional sites of large bowel wall thickening. Vascular/Lymphatic: Normal caliber abdominal aorta. Patent portal, splenic, hepatic and renal veins. Extrinsic mass-effect on the bifurcation of the main portal vein and left greater than  right portal veins. Multiple enlarged calcified left mesenteric nodes measuring up to 3.0 cm (series 2/image 61). Bulky calcified 4.1 cm central mesenteric node (series 2/image 54). Reproductive: Mildly enlarged prostate. Other: No pneumoperitoneum, ascites or focal fluid collection. Musculoskeletal: No aggressive appearing focal osseous lesions. Mild thoracolumbar spondylosis. IMPRESSION: 1. Irregular annular wall thickening in the proximal sigmoid colon measuring 5.5 cm in length, compatible with primary sigmoid colon carcinoma. 2. 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 3. Bulky hepatic metastatic disease involving much of the liver. 4. Solid 6 mm right lower lobe pulmonary nodule, cannot exclude pulmonary metastasis. Right pericardiophrenic adenopathy suspicious for mediastinal nodal metastasis. Recommend chest CT for further staging evaluation. 5. Nonspecific mild diffuse bladder wall thickening, probably due to chronic bladder outlet obstruction by the mildly enlarged prostate. No hydronephrosis. 6. Cholelithiasis. Electronically Signed   By: JIlona SorrelM.D.   On: 07/14/2017 19:09    Assessment:  The patient is a 52y.o. 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  He presented with iron deficiency anemia and weight loss.  CEA is 1403.  EGD on 07/14/2017 revealed a normal esophagus, congested mucosa in the gastric body, and multiple duodenal polyps. Biopsies were taken and pathology pending.    Abdomen and pelvic CT on 07/14/2017 revealed an irregular 5.5 cm annular wall thickening in the proximal sigmoid colon compatible with primary sigmoid colon carcinoma.  There were a partially calcified bulky left and central mesenteric lymph nodes.  There was bulky hepatic metastatic disease involving much of the liver.  There were > 5 enhancing liver lesions (17.8 x 13.2 cm centered in the left liver; 6.2 x  5.8 cm right liver dome and 4.3 x 3.6 cm posterior right liver).  There was a  solid 6 mm right lower lobe pulmonary nodule.  There was a 1.9 cm right pericardiophrenic lymph node.  He has iron defciency anemia.  Ferritin is 35.  Symptomatically, his epigastric pain has resolved.  Exam reveals LLQ pain.  Plan:   1.  Oncology:  Discussed with patient and his family likely diagnosis of metastatic colon cancer.  Discussed findings on CT scan.  Discussed plan for colonoscopy tomorrow.  Discuss typical treatment for metastatic colon cancer including either Avastin or cetuximab with FOLFOX chemotherapy.  Administration of treatment and potential side effects reviewed in detail.    Await colonoscopy tomorrow.  May consider biopsy of liver for confirmation of metastatic disease.  CEA is elevated.  Chest CT without contrast tomorrow.   2.  Hematology:  Iron deficiency anemia secondary to GI blood loss.  3.  Disposition:  Anticipate discharge home after colonoscopy tomorrow if cleared by GI.  Will follow-up biopsies in outpatient department.   Thank you for allowing me to participate in Sipriano Fendley 's care.  I will follow him closely with you while hospitalized and after discharge in the outpatient department.   Lequita Asal, MD  07/15/2017, 8:44 PM

## 2017-07-15 NOTE — Progress Notes (Signed)
Chaplain received recommendation to visit with patient from nurse today. Nurse indicated that patient had received some concerning news and could likely benefit from a visit with the chaplain. Upon entering room, patient was awake, alert, and generally in good spirits. Patient indicated that he had been in the hospital since last Wednesday following some gastric symptoms. Patient is on a liquid diet and awaiting a colonoscopy this afternoon. Patient indicates excellent family and community support. Patient expressed strong faith and is leaning on his faith in God in response to this health update. Chaplain provided spiritual and emotional support as well as incarnational presence. Patient and chaplain talked at length about patient's life and his hopes for the future as he awaits additional information. Chaplain provided spiritual support for patient through prayer.    07/15/17 1300  Clinical Encounter Type  Visited With Patient  Visit Type Spiritual support  Referral From Nurse  Spiritual Encounters  Spiritual Needs Prayer;Emotional  Stress Factors  Patient Stress Factors Health changes

## 2017-07-15 NOTE — Progress Notes (Signed)
Colonoscopy prep initiated as per order, patient instructed to drink the prep and educated about the importance of drinking the [place]  As per order .

## 2017-07-15 NOTE — Progress Notes (Signed)
Omar Lame, MD [facility]   [address], [place], [place] [address] Phone: [phone] Fax : [phone]   Subjective: The patient had a CT scan yesterday that was consistent with metastatic disease with a possible primary source being the patient's colon.  The patient had an upper endoscopy yesterday and was signed off to me today by Dr. Alice Reichert.  The patient denies any complaints today.   Objective: Vital signs in last 24 hours: Vitals:   07/14/17 1454 07/14/17 1923 07/15/17 0347 07/15/17 1358  BP: 131/78 118/72 118/76 123/81  Pulse: 75 90 66 71  Resp: 20 18 18 20   Temp: 98.8 F (37.1 C) 99.3 F (37.4 C) 98.7 F (37.1 C) 98.4 F (36.9 C)  TempSrc: Oral Oral Oral Oral  SpO2: 100% 99% 96% 99%  Weight:      Height:       Weight change:   Intake/Output Summary (Last 24 hours) at 07/15/2017 1443 Last data filed at 07/15/2017 1012 Gross per 24 hour  Intake 360 ml  Output -  Net 360 ml     Exam: Heart:: Regular rate and rhythm, S1S2 present or without murmur or extra heart sounds Lungs: normal and clear to auscultation and percussion Abdomen: soft, nontender, normal bowel sounds   Lab Results: @LABTEST2 @ Micro Results: No results found for this or any previous visit (from the past 240 hour(s)). Studies/Results: Ct Abdomen Pelvis W Contrast  Result Date: 07/14/2017 CLINICAL DATA:  Inpatient. Progressive weight loss of 35 pounds over the prior 8 months. Early satiety. Epigastric abdominal pain. Anemia. EXAM: CT ABDOMEN AND PELVIS WITH CONTRAST TECHNIQUE: Multidetector CT imaging of the abdomen and pelvis was performed using the standard protocol following bolus administration of intravenous contrast. CONTRAST:  145mL ISOVUE-300 IOPAMIDOL (ISOVUE-300) INJECTION 61% COMPARISON:  None. FINDINGS: Lower chest: Right lower lobe 6 mm solid pulmonary nodule (series 4/image 9). Bandlike scarring versus atelectasis in dependent basilar right lower lobe. Enlarged 1.9 cm right  pericardiophrenic node (series 7/image 5). Hepatobiliary: There are multiple (greater than 5) heterogeneously enhancing liver masses involving much of the liver. There is a dominant large 17.8 x 13.2 cm liver mass centered in the left liver lobe with involvement of right and left liver lobes (series 2/image 23), with central heterogeneous calcification. Additional representative 6.2 x 5.8 cm right liver dome (series 2/image 7) and posterior right liver lobe 4.3 x 3.6 cm (series 2/image 19) masses. Cholelithiasis. Contracted gallbladder. No definite gallbladder wall thickening. No pericholecystic fluid. There is intrahepatic biliary ductal dilatation in the left liver lobe peripheral to the dominant liver mass. Normal caliber common bile duct. Pancreas: Normal, with no mass or duct dilation. Spleen: Normal size. No mass. Adrenals/Urinary Tract: Normal adrenals. A few scattered subcentimeter hypodense renal cortical lesions in both kidneys are too small to characterize. No hydronephrosis. Nondistended bladder with suggestion of mild diffuse bladder wall thickening. Stomach/Bowel: Normal non-distended stomach. Normal caliber small bowel with no small bowel wall thickening. Normal appendix. Irregular annular wall thickening in the proximal sigmoid colon measuring 5.5 cm in length (series 2/image 63). No additional sites of large bowel wall thickening. Vascular/Lymphatic: Normal caliber abdominal aorta. Patent portal, splenic, hepatic and renal veins. Extrinsic mass-effect on the bifurcation of the main portal vein and left greater than right portal veins. Multiple enlarged calcified left mesenteric nodes measuring up to 3.0 cm (series 2/image 61). Bulky calcified 4.1 cm central mesenteric node (series 2/image 54). Reproductive: Mildly enlarged prostate. Other: No pneumoperitoneum, ascites or focal fluid collection. Musculoskeletal:  No aggressive appearing focal osseous lesions. Mild thoracolumbar spondylosis.  IMPRESSION: 1. Irregular annular wall thickening in the proximal sigmoid colon measuring 5.5 cm in length, compatible with primary sigmoid colon carcinoma. 2. 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 3. Bulky hepatic metastatic disease involving much of the liver. 4. Solid 6 mm right lower lobe pulmonary nodule, cannot exclude pulmonary metastasis. Right pericardiophrenic adenopathy suspicious for mediastinal nodal metastasis. Recommend chest CT for further staging evaluation. 5. Nonspecific mild diffuse bladder wall thickening, probably due to chronic bladder outlet obstruction by the mildly enlarged prostate. No hydronephrosis. 6. Cholelithiasis. Electronically Signed   By: Ilona Sorrel M.D.   On: 07/14/2017 19:09   Medications: I have reviewed the patient's current medications. Scheduled Meds: . pantoprazole  40 mg Oral BID   Continuous Infusions: PRN Meds:.acetaminophen **OR** acetaminophen, iohexol, ondansetron **OR** ondansetron (ZOFRAN) IV, oxyCODONE   Assessment: Active Problems:   GI bleed    Plan: The patient has a CT scan consistent with metastatic disease.  The patient has been put on a clear liquid diet and has been given a prep for a colonoscopy for tomorrow.  The patient has been explained the CT scan results and the possibility that he has metastatic colon cancer.  The patient has been explained the plan and agrees with it.   LOS: 2 days   Omar Young 07/15/2017, 2:43 PM

## 2017-07-15 NOTE — Progress Notes (Signed)
Patient ID: Omar Young, male   DOB: 1965/05/18, 52 y.o.   MRN: [id number]  [facility] PROGRESS NOTE  Favor Hackler HGD:[id number] DOB: 1965/08/04 DOA: 07/13/2017 PCP: Katheren Shams  HPI/Subjective: Patient feeling okay. No abdominal pain. States having bowel movements without problems.  Objective: Vitals:   07/15/17 0347 07/15/17 1358  BP: 118/76 123/81  Pulse: 66 71  Resp: 18 20  Temp: 98.7 F (37.1 C) 98.4 F (36.9 C)  SpO2: 96% 99%    Filed Weights   07/13/17 1331 07/13/17 1554  Weight: 108.9 kg (240 lb) 108.9 kg (240 lb)    ROS: Review of Systems  Constitutional: Negative for chills and fever.  Eyes: Negative for blurred vision.  Respiratory: Negative for cough and shortness of breath.   Cardiovascular: Negative for chest pain.  Gastrointestinal: Negative for abdominal pain, constipation, diarrhea, melena, nausea and vomiting.  Genitourinary: Negative for dysuria.  Musculoskeletal: Negative for joint pain.  Neurological: Negative for dizziness and headaches.   Exam: Physical Exam  Constitutional: He is oriented to person, place, and time.  HENT:  Nose: No mucosal edema.  Mouth/Throat: No oropharyngeal exudate or posterior oropharyngeal edema.  Eyes: Pupils are equal, round, and reactive to light. Conjunctivae, EOM and lids are normal.  Neck: No JVD present. Carotid bruit is not present. No edema present. No thyroid mass and no thyromegaly present.  Cardiovascular: S1 normal and S2 normal. Exam reveals no gallop.  No murmur heard. Pulses:      Dorsalis pedis pulses are 2+ on the right side, and 2+ on the left side.  Respiratory: No respiratory distress. He has no wheezes. He has no rhonchi. He has no rales.  GI: Soft. Bowel sounds are normal. There is no tenderness.  Musculoskeletal:       Right ankle: He exhibits no swelling.       Left ankle: He exhibits no swelling.  Lymphadenopathy:    He has no cervical adenopathy.  Neurological: He is  alert and oriented to person, place, and time. No cranial nerve deficit.  Skin: Skin is warm. No rash noted. Nails show no clubbing.  Psychiatric: He has a normal mood and affect.      Data Reviewed: Basic Metabolic Panel: Recent Labs  Lab 07/13/17 1335 07/14/17 0804  NA 135 135  K 3.8 3.7  CL 104 106  CO2 22 22  GLUCOSE 92 77  BUN 11 10  CREATININE 0.92 0.80  CALCIUM 9.0 8.5*   Liver Function Tests: Recent Labs  Lab 07/13/17 1335  AST 36  ALT 19  ALKPHOS 190*  BILITOT 0.8  PROT 8.3*  ALBUMIN 3.6   Recent Labs  Lab 07/13/17 1335  LIPASE 32   CBC: Recent Labs  Lab 07/13/17 1335 07/13/17 1722 07/14/17 0008 07/14/17 0804 07/14/17 1525 07/15/17 0741  WBC 9.1  --   --  6.9  --   --   HGB 9.8* 9.9* 8.7* 8.9*  9.0* 9.7* 8.8*  HCT 29.8*  --   --  27.1*  --   --   MCV 78.2*  --   --  77.9*  --   --   PLT 319  --   --  259  --   --    Cardiac Enzymes: Recent Labs  Lab 07/13/17 1335  TROPONINI <0.03    Studies: Ct Abdomen Pelvis W Contrast  Result Date: 07/14/2017 CLINICAL DATA:  Inpatient. Progressive weight loss of 35 pounds over the prior 8 months. Early satiety. Epigastric abdominal  pain. Anemia. EXAM: CT ABDOMEN AND PELVIS WITH CONTRAST TECHNIQUE: Multidetector CT imaging of the abdomen and pelvis was performed using the standard protocol following bolus administration of intravenous contrast. CONTRAST:  157mL ISOVUE-300 IOPAMIDOL (ISOVUE-300) INJECTION 61% COMPARISON:  None. FINDINGS: Lower chest: Right lower lobe 6 mm solid pulmonary nodule (series 4/image 9). Bandlike scarring versus atelectasis in dependent basilar right lower lobe. Enlarged 1.9 cm right pericardiophrenic node (series 7/image 5). Hepatobiliary: There are multiple (greater than 5) heterogeneously enhancing liver masses involving much of the liver. There is a dominant large 17.8 x 13.2 cm liver mass centered in the left liver lobe with involvement of right and left liver lobes (series  2/image 23), with central heterogeneous calcification. Additional representative 6.2 x 5.8 cm right liver dome (series 2/image 7) and posterior right liver lobe 4.3 x 3.6 cm (series 2/image 19) masses. Cholelithiasis. Contracted gallbladder. No definite gallbladder wall thickening. No pericholecystic fluid. There is intrahepatic biliary ductal dilatation in the left liver lobe peripheral to the dominant liver mass. Normal caliber common bile duct. Pancreas: Normal, with no mass or duct dilation. Spleen: Normal size. No mass. Adrenals/Urinary Tract: Normal adrenals. A few scattered subcentimeter hypodense renal cortical lesions in both kidneys are too small to characterize. No hydronephrosis. Nondistended bladder with suggestion of mild diffuse bladder wall thickening. Stomach/Bowel: Normal non-distended stomach. Normal caliber small bowel with no small bowel wall thickening. Normal appendix. Irregular annular wall thickening in the proximal sigmoid colon measuring 5.5 cm in length (series 2/image 63). No additional sites of large bowel wall thickening. Vascular/Lymphatic: Normal caliber abdominal aorta. Patent portal, splenic, hepatic and renal veins. Extrinsic mass-effect on the bifurcation of the main portal vein and left greater than right portal veins. Multiple enlarged calcified left mesenteric nodes measuring up to 3.0 cm (series 2/image 61). Bulky calcified 4.1 cm central mesenteric node (series 2/image 54). Reproductive: Mildly enlarged prostate. Other: No pneumoperitoneum, ascites or focal fluid collection. Musculoskeletal: No aggressive appearing focal osseous lesions. Mild thoracolumbar spondylosis. IMPRESSION: 1. Irregular annular wall thickening in the proximal sigmoid colon measuring 5.5 cm in length, compatible with primary sigmoid colon carcinoma. 2. 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 3. Bulky hepatic  metastatic disease involving much of the liver. 4. Solid 6 mm right lower lobe pulmonary nodule, cannot exclude pulmonary metastasis. Right pericardiophrenic adenopathy suspicious for mediastinal nodal metastasis. Recommend chest CT for further staging evaluation. 5. Nonspecific mild diffuse bladder wall thickening, probably due to chronic bladder outlet obstruction by the mildly enlarged prostate. No hydronephrosis. 6. Cholelithiasis. Electronically Signed   By: Ilona Sorrel M.D.   On: 07/14/2017 19:09    Scheduled Meds: . pantoprazole  40 mg Oral BID   Continuous Infusions:   Assessment/Plan:  1. 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  Case discussed with oncology to see the patient.  Case discussed with gastroenterology to do a colonoscopy for tomorrow.  Case also discussed with interventional radiologist just in case a liver biopsy for sample will be needed.  We decided on proceeding with colonoscopy tomorrow.  Hopefully we will get some tissue to biopsy and send off the lab.   2. Acute blood loss anemia. EGD showing congestive mucosa in the gastric body and multiple duodenal polyps.  Patient given IV Venofer yesterday.  Code Status:     Code Status Orders  (From admission, onward)        Start     Ordered  07/13/17 1737  Full code  Continuous     07/13/17 1736    Code Status History    This patient has a current code status but no historical code status.     Family Communication: Spoke with family at the bedside Disposition Plan: Potential disposition after colonoscopy tomorrow depending on patient's condition.  Consultants:  Gastroenterology  Oncology  Procedures:  Endoscopy  Time spent:  40 minutes including ACP time  [employer]

## 2017-07-16 ENCOUNTER — Encounter
Admission: EM | Disposition: A | Discharge status: Home / Self Care | Payer: Self-pay | Source: Home / Self Care | Attending: Internal Medicine

## 2017-07-16 ENCOUNTER — Encounter: Payer: Self-pay | Admitting: Anesthesiology

## 2017-07-16 ENCOUNTER — Inpatient Hospital Stay: Payer: BLUE CROSS/BLUE SHIELD

## 2017-07-16 ENCOUNTER — Inpatient Hospital Stay: Payer: BLUE CROSS/BLUE SHIELD | Admitting: Anesthesiology

## 2017-07-16 DIAGNOSIS — D649 Anemia, unspecified: Secondary | ICD-10-CM

## 2017-07-16 DIAGNOSIS — D125 Benign neoplasm of sigmoid colon: Secondary | ICD-10-CM

## 2017-07-16 DIAGNOSIS — R933 Abnormal findings on diagnostic imaging of other parts of digestive tract: Secondary | ICD-10-CM

## 2017-07-16 DIAGNOSIS — D490 Neoplasm of unspecified behavior of digestive system: Secondary | ICD-10-CM

## 2017-07-16 DIAGNOSIS — K635 Polyp of colon: Secondary | ICD-10-CM

## 2017-07-16 HISTORY — PX: COLONOSCOPY WITH PROPOFOL: SHX5780

## 2017-07-16 LAB — HEMOGLOBIN: HEMOGLOBIN: 9.1 g/dL — AB (ref 13.0–18.0)

## 2017-07-16 LAB — CEA: CEA: 1403 ng/mL — ABNORMAL HIGH (ref 0.0–4.7)

## 2017-07-16 SURGERY — COLONOSCOPY WITH PROPOFOL
Anesthesia: General

## 2017-07-16 MED ORDER — PROPOFOL 500 MG/50ML IV EMUL
INTRAVENOUS | Status: DC | PRN
  Administered 2017-07-16: 150 ug/kg/min via INTRAVENOUS

## 2017-07-16 MED ORDER — PROPOFOL 500 MG/50ML IV EMUL
INTRAVENOUS | Status: AC
  Filled 2017-07-16: qty 50

## 2017-07-16 MED ORDER — LIDOCAINE HCL (CARDIAC) PF 100 MG/5ML IV SOSY
PREFILLED_SYRINGE | INTRAVENOUS | Status: DC | PRN
  Administered 2017-07-16: 100 mg via INTRAVENOUS

## 2017-07-16 MED ORDER — SODIUM CHLORIDE 0.9 % IV SOLN
INTRAVENOUS | Status: DC
  Administered 2017-07-16: 1000 mL via INTRAVENOUS

## 2017-07-16 MED ORDER — PROPOFOL 10 MG/ML IV BOLUS
INTRAVENOUS | Status: AC
  Filled 2017-07-16: qty 20

## 2017-07-16 MED ORDER — PROPOFOL 10 MG/ML IV BOLUS
INTRAVENOUS | Status: DC | PRN
  Administered 2017-07-16: 10 mg via INTRAVENOUS
  Administered 2017-07-16: 60 mg via INTRAVENOUS

## 2017-07-16 MED ORDER — LIDOCAINE HCL (PF) 2 % IJ SOLN
INTRAMUSCULAR | Status: AC
  Filled 2017-07-16: qty 10

## 2017-07-16 NOTE — Progress Notes (Signed)
Omar Antigua, MD [address], [place], [place], [place], [address] [address], [place], [place], [place], [address] Phone: [phone]  Fax: [phone]   Subjective: Patient underwent colonoscopy today and tolerated the procedure well.  Found to have a colon mass at 60 cm.  Please see procedure report for findings.  Denies any abdominal pain postprocedure.   Objective: Exam: Vital signs in last 24 hours: Vitals:   07/16/17 1012 07/16/17 1027 07/16/17 1042 07/16/17 1057  BP: (!) 100/53 133/71 125/77 120/85  Pulse: 62 70 (!) 58 65  Resp: (!) 25 18 20  (!) 21  Temp: 97.6 F (36.4 C)     TempSrc: Temporal     SpO2: 100% 100% 100% 100%  Weight:      Height:       Weight change:   Intake/Output Summary (Last 24 hours) at 07/16/2017 1146 Last data filed at 07/16/2017 1011 Gross per 24 hour  Intake 500 ml  Output 1 ml  Net 499 ml    General: No acute distress, AAO x3 Abd: Soft, NT/ND, No HSM Skin: Warm, no rashes Neck: Supple, Trachea midline   Lab Results: Lab Results  Component Value Date   WBC 6.9 07/14/2017   HGB 9.1 (L) 07/16/2017   HCT 27.1 (L) 07/14/2017   MCV 77.9 (L) 07/14/2017   PLT 259 07/14/2017   Micro Results: No results found for this or any previous visit (from the past 240 hour(s)). Studies/Results: Ct Chest Wo Contrast  Result Date: 07/16/2017 CLINICAL DATA:  Patient with pulmonary nodule visualized on prior abdomen pelvic CT. Recent diagnosis colon cancer. EXAM: CT CHEST WITHOUT CONTRAST TECHNIQUE: Multidetector CT imaging of the chest was performed following the standard protocol without IV contrast. COMPARISON:  CT abdomen pelvis 07/14/2017; chest radiograph 07/13/2017. FINDINGS: Cardiovascular: Heart is mildly enlarged. No pericardial effusion. Thoracic aortic vascular calcifications. Mediastinum/Nodes: 1.2 cm pretracheal lymph node (image 36; series 2). 1.8 cm right pericardiophrenic lymph node (image 99; series 2). There is a 1.2  cm right internal mammary lymph node (image 54; series 2). No axillary adenopathy. Esophagus is normal in appearance. Lungs/Pleura: Central airways are patent. Patchy consolidative opacities within the lower lobes bilaterally. 6 mm right lower lobe nodule (image 113; series 3). 3 mm right upper lobe nodule (image 53; series 3). 4 mm subpleural right upper lobe nodule (image 36; series 3). 5 mm right upper lobe nodule (image 44; series 3). 4 mm right upper lobe nodule (image 51; series 3). 3 mm left lower lobe nodule (image 77; series 3). Patchy consolidation within the right middle lobe. No pleural effusion or pneumothorax. Upper Abdomen: Partially calcified large mass within the liver, better visualized on contrast-enhanced abdomen pelvic CT 07/14/2017. Musculoskeletal: Thoracic spine degenerative changes. No aggressive or acute appearing osseous lesions. IMPRESSION: 1. Scattered bilateral pulmonary nodules as above, nonspecific in etiology. Metastatic disease not excluded. Recommend attention on follow-up. 2. Patchy consolidation within the right middle, right lower and left lower lobes which may represent atelectasis. 3. Enlarged right pericardiophrenic lymph node, right internal mammary node and pretracheal lymph node, raising the possibility of nodal metastatic disease. 4. Poorly visualized large mass within the liver without intravenous contrast material. See dedicated CT abdomen pelvis report 07/14/2017. 5. Aortic Atherosclerosis (ICD10-I70.0). Electronically Signed   By: Lovey Newcomer M.D.   On: 07/16/2017 11:13   Ct Abdomen Pelvis W Contrast  Result Date: 07/14/2017 CLINICAL DATA:  Inpatient. Progressive weight loss of 35 pounds over the prior 8 months. Early satiety.  Epigastric abdominal pain. Anemia. EXAM: CT ABDOMEN AND PELVIS WITH CONTRAST TECHNIQUE: Multidetector CT imaging of the abdomen and pelvis was performed using the standard protocol following bolus administration of intravenous contrast.  CONTRAST:  148mL ISOVUE-300 IOPAMIDOL (ISOVUE-300) INJECTION 61% COMPARISON:  None. FINDINGS: Lower chest: Right lower lobe 6 mm solid pulmonary nodule (series 4/image 9). Bandlike scarring versus atelectasis in dependent basilar right lower lobe. Enlarged 1.9 cm right pericardiophrenic node (series 7/image 5). Hepatobiliary: There are multiple (greater than 5) heterogeneously enhancing liver masses involving much of the liver. There is a dominant large 17.8 x 13.2 cm liver mass centered in the left liver lobe with involvement of right and left liver lobes (series 2/image 23), with central heterogeneous calcification. Additional representative 6.2 x 5.8 cm right liver dome (series 2/image 7) and posterior right liver lobe 4.3 x 3.6 cm (series 2/image 19) masses. Cholelithiasis. Contracted gallbladder. No definite gallbladder wall thickening. No pericholecystic fluid. There is intrahepatic biliary ductal dilatation in the left liver lobe peripheral to the dominant liver mass. Normal caliber common bile duct. Pancreas: Normal, with no mass or duct dilation. Spleen: Normal size. No mass. Adrenals/Urinary Tract: Normal adrenals. A few scattered subcentimeter hypodense renal cortical lesions in both kidneys are too small to characterize. No hydronephrosis. Nondistended bladder with suggestion of mild diffuse bladder wall thickening. Stomach/Bowel: Normal non-distended stomach. Normal caliber small bowel with no small bowel wall thickening. Normal appendix. Irregular annular wall thickening in the proximal sigmoid colon measuring 5.5 cm in length (series 2/image 63). No additional sites of large bowel wall thickening. Vascular/Lymphatic: Normal caliber abdominal aorta. Patent portal, splenic, hepatic and renal veins. Extrinsic mass-effect on the bifurcation of the main portal vein and left greater than right portal veins. Multiple enlarged calcified left mesenteric nodes measuring up to 3.0 cm (series 2/image 61). Bulky  calcified 4.1 cm central mesenteric node (series 2/image 54). Reproductive: Mildly enlarged prostate. Other: No pneumoperitoneum, ascites or focal fluid collection. Musculoskeletal: No aggressive appearing focal osseous lesions. Mild thoracolumbar spondylosis. IMPRESSION: 1. Irregular annular wall thickening in the proximal sigmoid colon measuring 5.5 cm in length, compatible with primary sigmoid colon carcinoma. 2. 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 3. Bulky hepatic metastatic disease involving much of the liver. 4. Solid 6 mm right lower lobe pulmonary nodule, cannot exclude pulmonary metastasis. Right pericardiophrenic adenopathy suspicious for mediastinal nodal metastasis. Recommend chest CT for further staging evaluation. 5. Nonspecific mild diffuse bladder wall thickening, probably due to chronic bladder outlet obstruction by the mildly enlarged prostate. No hydronephrosis. 6. Cholelithiasis. Electronically Signed   By: Ilona Sorrel M.D.   On: 07/14/2017 19:09   Medications:  Scheduled Meds: . pantoprazole  40 mg Oral BID   Continuous Infusions: PRN Meds:.acetaminophen **OR** acetaminophen, iohexol, ondansetron **OR** ondansetron (ZOFRAN) IV, oxyCODONE   Assessment: Active Problems:   GI bleed   Liver mass   Anemia   Colonic neoplasm   Polyp of sigmoid colon   Abnormal computed tomography of large intestine    Plan: Biopsies of colon mass performed  A polyp removed via cold snare and clips placed in the sigmoid colon as well See procedure report for details and findings and recommendations Please follow-up biopsies Follow oncology recommendations for further plan of care Mass is friable, circumferential, but not obstructing Clear liquid diet okay today Can advance to soft diet tomorrow.    Findings discussed with patient and his wife who verbalized understanding of the  colon mass, and  it likely being malignant, with biopsies pending for confirmation.  All questions answered to their satisfaction   LOS: 3 days   Omar Antigua, MD 07/16/2017, 11:46 AM

## 2017-07-16 NOTE — Anesthesia Preprocedure Evaluation (Signed)
Anesthesia Evaluation  Patient identified by MRN, date of birth, ID band Patient awake    Reviewed: Allergy & Precautions, H&P , NPO status , Patient's Chart, lab work & pertinent test results  History of Anesthesia Complications Negative for: history of anesthetic complications  Airway Mallampati: III  TM Distance: >3 FB Neck ROM: full    Dental  (+) Chipped, Poor Dentition   Pulmonary neg shortness of breath,  Signs and symptoms suggestive of sleep apnea            Cardiovascular Exercise Tolerance: Good (-) angina(-) Past MI and (-) DOE negative cardio ROS       Neuro/Psych negative neurological ROS  negative psych ROS   GI/Hepatic negative GI ROS, Neg liver ROS, neg GERD  ,  Endo/Other  negative endocrine ROS  Renal/GU negative Renal ROS  negative genitourinary   Musculoskeletal   Abdominal   Peds  Hematology negative hematology ROS (+)   Anesthesia Other Findings Past Medical History: No date: Hyperlipemia  Past Surgical History: 07/14/2017: ESOPHAGOGASTRODUODENOSCOPY (EGD) WITH PROPOFOL; N/A     Comment:  Procedure: ESOPHAGOGASTRODUODENOSCOPY (EGD) WITH               PROPOFOL;  Surgeon: Toledo, Benay Pike, MD;  Location:               [facility] ENDOSCOPY;  Service: Gastroenterology;  Laterality:               N/A; No date: none  BMI    Body Mass Index:  34.44 kg/m      Reproductive/Obstetrics negative OB ROS                             Anesthesia Physical Anesthesia Plan  ASA: III  Anesthesia Plan: General   Post-op Pain Management:    Induction: Intravenous  PONV Risk Score and Plan: Propofol infusion and TIVA  Airway Management Planned: Natural Airway and Nasal Cannula  Additional Equipment:   Intra-op Plan:   Post-operative Plan:   Informed Consent: I have reviewed the patients History and Physical, chart, labs and discussed the procedure including the  risks, benefits and alternatives for the proposed anesthesia with the patient or authorized representative who has indicated his/her understanding and acceptance.   Dental Advisory Given  Plan Discussed with: Anesthesiologist, CRNA and Surgeon  Anesthesia Plan Comments: (Patient consented for risks of anesthesia including but not limited to:  - adverse reactions to medications - risk of intubation if required - damage to teeth, lips or other oral mucosa - sore throat or hoarseness - Damage to heart, brain, lungs or loss of life  Patient voiced understanding.)        Anesthesia Quick Evaluation

## 2017-07-16 NOTE — Op Note (Signed)
[facility]astroenterology Patient Name: Omar Young Procedure Date: 07/16/2017 9:35 AM MRN: [id number] Account #: [id number] Date of Birth: 11-26-1965 Admit Type: Inpatient Age: 52 Room: [facility] ENDO ROOM 4 Gender: Male Note Status: Finalized Procedure:            Colonoscopy Indications:          Suspected colon cancer, Abnormal CT of the GI tract Providers:            Varnita B. Bonna Gains MD, MD Referring MD:         Forest Gleason Md, MD (Referring MD) Medicines:            Monitored Anesthesia Care Complications:        No immediate complications. Procedure:            Pre-Anesthesia Assessment:                       - Prior to the procedure, a History and Physical was                        performed, and patient medications, allergies and                        sensitivities were reviewed. The patient's tolerance of                        previous anesthesia was reviewed.                       - The risks and benefits of the procedure and the                        sedation options and risks were discussed with the                        patient. All questions were answered and informed                        consent was obtained.                       - Patient identification and proposed procedure were                        verified prior to the procedure by the physician, the                        nurse, the anesthesiologist, the anesthetist and the                        technician. The procedure was verified in the                        pre-procedure area in the procedure room in the                        endoscopy suite.                       - Prophylactic Antibiotics: The patient does not  require prophylactic antibiotics.                       - ASA Grade Assessment: III - A patient with severe                        systemic disease.                       - After reviewing the risks and benefits, the patient         was deemed in satisfactory condition to undergo the                        procedure.                       - Monitored anesthesia care was determined to be                        medically necessary for this procedure based on review                        of the patient's medical history, medications, and                        prior anesthesia history.                       - The anesthesia plan was to use monitored anesthesia                        care (MAC).                       After obtaining informed consent, the colonoscope was                        passed under direct vision. Throughout the procedure,                        the patient's blood pressure, pulse, and oxygen                        saturations were monitored continuously. The                        Colonoscope was introduced through the anus with the                        intention of advancing to the splenic flexure. The                        scope was advanced to the descending colon before the                        procedure was aborted. Medications were given. The                        colonoscopy was performed with ease. The patient                        tolerated the  procedure well. The quality of the bowel                        preparation was fair. Findings:      The perianal and digital rectal examinations were normal.      An infiltrative and ulcerated non-obstructing large mass was found at 60       cm proximal to the anus. The mass was circumferential. Friable. Mucosa       was biopsied with a cold forceps for histology. The mass was friable and       ulcerated and attempt to gently advance past the site lead to oozing       from the mass. It was thus not considered safe to advance the       colonoscope past this site.      The lumen was open and visible past the mass.      A 6 mm polyp was found in the sigmoid colon. The polyp was sessile. The       polyp was removed with a cold snare.  Resection and retrieval were       complete. To prevent bleeding after the polypectomy, two hemostatic       clips were successfully placed. There was no bleeding at the end of the       procedure.      The retroflexed view of the distal rectum and anal verge was normal and       showed no anal or rectal abnormalities. Impression:           - Preparation of the colon was fair.                       - Likely malignant tumor at 60 cm proximal to the anus.                        Biopsied. The mass was circumferential. Friable. Mucosa                        was biopsied with a cold forceps for histology. The                        mass was friable and ulcerated and attempt to gently                        advance past the site lead to oozing from the mass. It                        was thus not considered safe to advance the colonoscope                        past this site.                       The lumen was open and visible past the mass.                       - One 6 mm polyp in the sigmoid colon, removed with a                        cold snare. Resected and retrieved. Clips were  placed.                       - The distal rectum and anal verge are normal on                        retroflexion view. Recommendation:       - Await pathology results.                       - Return patient to hospital ward for ongoing care.                       - Clear liquid diet today.                       - Further reconmmendations as per inpatient GI team                       Follow oncology recommendations and place consult if                        not already done                       - The findings and recommendations were discussed with                        the patient.                       - The findings and recommendations were discussed with                        the designated responsible adult. Procedure Code(s):    --- Professional ---                       [phone], 52, Colonoscopy,  flexible; with removal of                        tumor(s), polyp(s), or other lesion(s) by snare                        technique Diagnosis Code(s):    --- Professional ---                       D49.0, Neoplasm of unspecified behavior of digestive                        system                       D12.5, Benign neoplasm of sigmoid colon                       R93.3, Abnormal findings on diagnostic imaging of other                        parts of digestive tract CPT copyright 2017 American Medical Association. All rights reserved. The codes documented in this report are preliminary and upon coder review may  be revised to meet current compliance requirements.  Vonda Antigua, MD Margretta Sidle B. Tahiliani MD,  MD 07/16/2017 10:10:31 AM This report has been signed electronically. Number of Addenda: 0 Note Initiated On: 07/16/2017 9:35 AM Total Procedure Duration: 0 hours 16 minutes 33 seconds  Estimated Blood Loss: Estimated blood loss: none.      [facility]

## 2017-07-16 NOTE — Progress Notes (Signed)
Report to Matthew RN

## 2017-07-16 NOTE — Anesthesia Postprocedure Evaluation (Signed)
Anesthesia Post Note  Patient: Omar Young  Procedure(s) Performed: COLONOSCOPY WITH PROPOFOL (N/A )  Patient location during evaluation: PACU Anesthesia Type: General Level of consciousness: awake and alert Pain management: pain level controlled Vital Signs Assessment: post-procedure vital signs reviewed and stable Respiratory status: spontaneous breathing, nonlabored ventilation, respiratory function stable and patient connected to nasal cannula oxygen Cardiovascular status: blood pressure returned to baseline and stable Postop Assessment: no apparent nausea or vomiting Anesthetic complications: no     Last Vitals:  Vitals:   07/16/17 1027 07/16/17 1042  BP: 133/71 125/77  Pulse: 70 (!) 58  Resp: 18 20  Temp:    SpO2: 100% 100%    Last Pain:  Vitals:   07/16/17 1042  TempSrc:   PainSc: 0-No pain                 Precious Haws Kiona Blume

## 2017-07-16 NOTE — H&P (Signed)
Vonda Antigua, MD [address], [place], [place], [place], [address] [address], [place], [place], [place], [address] Phone: [phone]  Fax: [phone]  Primary Care Physician:  Katheren Shams   Pre-Procedure History & Physical: HPI:  Omar Young is a 52 y.o. male is here for a colonoscopy.   Past Medical History:  Diagnosis Date  . Hyperlipemia     Past Surgical History:  Procedure Laterality Date  . ESOPHAGOGASTRODUODENOSCOPY (EGD) WITH PROPOFOL N/A 07/14/2017   Procedure: ESOPHAGOGASTRODUODENOSCOPY (EGD) WITH PROPOFOL;  Surgeon: Toledo, Benay Pike, MD;  Location: [facility] ENDOSCOPY;  Service: Gastroenterology;  Laterality: N/A;  . none      Prior to Admission medications   Medication Sig Start Date End Date Taking? Authorizing Provider  ondansetron (ZOFRAN) 4 MG tablet Take 1 tablet (4 mg total) by mouth every 6 (six) hours as needed for nausea. 07/14/17   Loletha Grayer, MD  pantoprazole (PROTONIX) 40 MG tablet Take 1 tablet (40 mg total) by mouth 2 (two) times daily. 07/15/17   Loletha Grayer, MD    Allergies as of 07/13/2017 - Review Complete 07/13/2017  Allergen Reaction Noted  . Codeine  07/13/2017    Family History  Problem Relation Age of Onset  . Diabetes Mother   . Hypertension Father     Social History   Socioeconomic History  . Marital status: Single    Spouse name: Not on file  . Number of children: Not on file  . Years of education: Not on file  . Highest education level: Not on file  Occupational History  . Not on file  Social Needs  . Financial resource strain: Not on file  . Food insecurity:    Worry: Not on file    Inability: Not on file  . Transportation needs:    Medical: Not on file    Non-medical: Not on file  Tobacco Use  . Smoking status: Never Smoker  . Smokeless tobacco: Never Used  Substance and Sexual Activity  . Alcohol use: Not Currently  . Drug use: Never  . Sexual activity: Yes  Lifestyle  .  Physical activity:    Days per week: Not on file    Minutes per session: Not on file  . Stress: Not on file  Relationships  . Social connections:    Talks on phone: Not on file    Gets together: Not on file    Attends religious service: Not on file    Active member of club or organization: Not on file    Attends meetings of clubs or organizations: Not on file    Relationship status: Not on file  . Intimate partner violence:    Fear of current or ex partner: Not on file    Emotionally abused: Not on file    Physically abused: Not on file    Forced sexual activity: Not on file  Other Topics Concern  . Not on file  Social History Narrative  . Not on file    Review of Systems: See HPI, otherwise negative ROS  Physical Exam: BP 131/83   Pulse 66   Temp 98.2 F (36.8 C) (Tympanic)   Resp 18   Ht 5\' 10"  (1.778 m)   Wt 240 lb (108.9 kg)   SpO2 99%   BMI 34.44 kg/m  General:   Alert,  pleasant and cooperative in NAD Head:  Normocephalic and atraumatic. Neck:  Supple; no masses or thyromegaly. Lungs:  Clear throughout to auscultation, normal respiratory effort.  Heart:  +S1, +S2, Regular rate and rhythm, No edema. Abdomen:  Soft, nontender and nondistended. Normal bowel sounds, without guarding, and without rebound.   Neurologic:  Alert and  oriented x4;  grossly normal neurologically.  Impression/Plan: Omar Young is here for a colonoscopy to be performed to rule out colon malignancy  Risks, benefits, limitations, and alternatives regarding  colonoscopy have been reviewed with the patient.  Questions have been answered.  All parties agreeable.   Virgel Manifold, MD  07/16/2017, 9:33 AM

## 2017-07-16 NOTE — Progress Notes (Signed)
MD notified patient has not finished his prep at midnight. MD stated it was okay for patient to keep drinking prep past midnight. Pt stating he is unable to finish his colonoscopy prep. States it takes all he has got in him just to swallow it and he feels very weak. States he is having brown watery BM's but he cannot see through it yet. Educated patient on importance of having clear BM's for the colonoscopy. Patient stated he would try and drink more. Will continue to monitor. Ammie Dalton, RN

## 2017-07-16 NOTE — Anesthesia Post-op Follow-up Note (Signed)
Anesthesia QCDR form completed.        

## 2017-07-16 NOTE — Transfer of Care (Signed)
Immediate Anesthesia Transfer of Care Note  Patient: Omar Young  Procedure(s) Performed: COLONOSCOPY WITH PROPOFOL (N/A )  Patient Location: PACU  Anesthesia Type:General  Level of Consciousness: awake, alert  and oriented  Airway & Oxygen Therapy: Patient Spontanous Breathing and Patient connected to nasal cannula oxygen  Post-op Assessment: Report given to RN and Post -op Vital signs reviewed and stable  Post vital signs: Reviewed and stable  Last Vitals:  Vitals Value Taken Time  BP 100/53 07/16/2017 10:12 AM  Temp 36.4 C 07/16/2017 10:12 AM  Pulse 61 07/16/2017 10:12 AM  Resp 25 07/16/2017 10:12 AM  SpO2 100 % 07/16/2017 10:12 AM  Vitals shown include unvalidated device data.  Last Pain:  Vitals:   07/16/17 1012  TempSrc: Temporal  PainSc:          Complications: No apparent anesthesia complications

## 2017-07-16 NOTE — Progress Notes (Signed)
Patient ID: Omar Young, male   DOB: 07-15-1965, 52 y.o.   MRN: [id number]  [employer] PROGRESS NOTE  Omar Young BHA:[id number] DOB: 09/20/1965 DOA: 07/13/2017 PCP: Katheren Shams  HPI/Subjective: Patient had a colonoscopy and had a biopsy of the mass, there was some oozing noted with the biopsy  Objective: Vitals:   07/16/17 1057 07/16/17 1241  BP: 120/85 118/70  Pulse: 65 61  Resp: (!) 21 20  Temp:  98.7 F (37.1 C)  SpO2: 100% 100%    Filed Weights   07/13/17 1331 07/13/17 1554  Weight: 108.9 kg (240 lb) 108.9 kg (240 lb)    ROS: Review of Systems  Constitutional: Negative for chills and fever.  Eyes: Negative for blurred vision.  Respiratory: Negative for cough and shortness of breath.   Cardiovascular: Negative for chest pain.  Gastrointestinal: Negative for abdominal pain, constipation, diarrhea, melena, nausea and vomiting.  Genitourinary: Negative for dysuria.  Musculoskeletal: Negative for joint pain.  Neurological: Negative for dizziness and headaches.   Exam: Physical Exam  Constitutional: He is oriented to person, place, and time.  HENT:  Nose: No mucosal edema.  Mouth/Throat: No oropharyngeal exudate or posterior oropharyngeal edema.  Eyes: Pupils are equal, round, and reactive to light. Conjunctivae, EOM and lids are normal.  Neck: No JVD present. Carotid bruit is not present. No edema present. No thyroid mass and no thyromegaly present.  Cardiovascular: S1 normal and S2 normal. Exam reveals no gallop.  No murmur heard. Pulses:      Dorsalis pedis pulses are 2+ on the right side, and 2+ on the left side.  Respiratory: No respiratory distress. He has no wheezes. He has no rhonchi. He has no rales.  GI: Soft. Bowel sounds are normal. There is no tenderness.  Musculoskeletal:       Right ankle: He exhibits no swelling.       Left ankle: He exhibits no swelling.  Lymphadenopathy:    He has no cervical adenopathy.  Neurological: He is  alert and oriented to person, place, and time. No cranial nerve deficit.  Skin: Skin is warm. No rash noted. Nails show no clubbing.  Psychiatric: He has a normal mood and affect.      Data Reviewed: Basic Metabolic Panel: Recent Labs  Lab 07/13/17 1335 07/14/17 0804 07/15/17 1948  NA 135 135 135  K 3.8 3.7 3.5  CL 104 106 103  CO2 22 22 23   GLUCOSE 92 77 85  BUN 11 10 10   CREATININE 0.92 0.80 0.82  CALCIUM 9.0 8.5* 8.8*   Liver Function Tests: Recent Labs  Lab 07/13/17 1335  AST 36  ALT 19  ALKPHOS 190*  BILITOT 0.8  PROT 8.3*  ALBUMIN 3.6   Recent Labs  Lab 07/13/17 1335  LIPASE 32   CBC: Recent Labs  Lab 07/13/17 1335  07/14/17 0008 07/14/17 0804 07/14/17 1525 07/15/17 0741 07/16/17 0405  WBC 9.1  --   --  6.9  --   --   --   HGB 9.8*   < > 8.7* 8.9*  9.0* 9.7* 8.8* 9.1*  HCT 29.8*  --   --  27.1*  --   --   --   MCV 78.2*  --   --  77.9*  --   --   --   PLT 319  --   --  259  --   --   --    < > = values in this interval not displayed.  Cardiac Enzymes: Recent Labs  Lab 07/13/17 1335  TROPONINI <0.03    Studies: Ct Chest Wo Contrast  Result Date: 07/16/2017 CLINICAL DATA:  Patient with pulmonary nodule visualized on prior abdomen pelvic CT. Recent diagnosis colon cancer. EXAM: CT CHEST WITHOUT CONTRAST TECHNIQUE: Multidetector CT imaging of the chest was performed following the standard protocol without IV contrast. COMPARISON:  CT abdomen pelvis 07/14/2017; chest radiograph 07/13/2017. FINDINGS: Cardiovascular: Heart is mildly enlarged. No pericardial effusion. Thoracic aortic vascular calcifications. Mediastinum/Nodes: 1.2 cm pretracheal lymph node (image 36; series 2). 1.8 cm right pericardiophrenic lymph node (image 99; series 2). There is a 1.2 cm right internal mammary lymph node (image 54; series 2). No axillary adenopathy. Esophagus is normal in appearance. Lungs/Pleura: Central airways are patent. Patchy consolidative opacities within  the lower lobes bilaterally. 6 mm right lower lobe nodule (image 113; series 3). 3 mm right upper lobe nodule (image 53; series 3). 4 mm subpleural right upper lobe nodule (image 36; series 3). 5 mm right upper lobe nodule (image 44; series 3). 4 mm right upper lobe nodule (image 51; series 3). 3 mm left lower lobe nodule (image 77; series 3). Patchy consolidation within the right middle lobe. No pleural effusion or pneumothorax. Upper Abdomen: Partially calcified large mass within the liver, better visualized on contrast-enhanced abdomen pelvic CT 07/14/2017. Musculoskeletal: Thoracic spine degenerative changes. No aggressive or acute appearing osseous lesions. IMPRESSION: 1. Scattered bilateral pulmonary nodules as above, nonspecific in etiology. Metastatic disease not excluded. Recommend attention on follow-up. 2. Patchy consolidation within the right middle, right lower and left lower lobes which may represent atelectasis. 3. Enlarged right pericardiophrenic lymph node, right internal mammary node and pretracheal lymph node, raising the possibility of nodal metastatic disease. 4. Poorly visualized large mass within the liver without intravenous contrast material. See dedicated CT abdomen pelvis report 07/14/2017. 5. Aortic Atherosclerosis (ICD10-I70.0). Electronically Signed   By: Lovey Newcomer M.D.   On: 07/16/2017 11:13   Ct Abdomen Pelvis W Contrast  Result Date: 07/14/2017 CLINICAL DATA:  Inpatient. Progressive weight loss of 35 pounds over the prior 8 months. Early satiety. Epigastric abdominal pain. Anemia. EXAM: CT ABDOMEN AND PELVIS WITH CONTRAST TECHNIQUE: Multidetector CT imaging of the abdomen and pelvis was performed using the standard protocol following bolus administration of intravenous contrast. CONTRAST:  165mL ISOVUE-300 IOPAMIDOL (ISOVUE-300) INJECTION 61% COMPARISON:  None. FINDINGS: Lower chest: Right lower lobe 6 mm solid pulmonary nodule (series 4/image 9). Bandlike scarring versus  atelectasis in dependent basilar right lower lobe. Enlarged 1.9 cm right pericardiophrenic node (series 7/image 5). Hepatobiliary: There are multiple (greater than 5) heterogeneously enhancing liver masses involving much of the liver. There is a dominant large 17.8 x 13.2 cm liver mass centered in the left liver lobe with involvement of right and left liver lobes (series 2/image 23), with central heterogeneous calcification. Additional representative 6.2 x 5.8 cm right liver dome (series 2/image 7) and posterior right liver lobe 4.3 x 3.6 cm (series 2/image 19) masses. Cholelithiasis. Contracted gallbladder. No definite gallbladder wall thickening. No pericholecystic fluid. There is intrahepatic biliary ductal dilatation in the left liver lobe peripheral to the dominant liver mass. Normal caliber common bile duct. Pancreas: Normal, with no mass or duct dilation. Spleen: Normal size. No mass. Adrenals/Urinary Tract: Normal adrenals. A few scattered subcentimeter hypodense renal cortical lesions in both kidneys are too small to characterize. No hydronephrosis. Nondistended bladder with suggestion of mild diffuse bladder wall thickening. Stomach/Bowel: Normal non-distended stomach. Normal caliber small bowel with  no small bowel wall thickening. Normal appendix. Irregular annular wall thickening in the proximal sigmoid colon measuring 5.5 cm in length (series 2/image 63). No additional sites of large bowel wall thickening. Vascular/Lymphatic: Normal caliber abdominal aorta. Patent portal, splenic, hepatic and renal veins. Extrinsic mass-effect on the bifurcation of the main portal vein and left greater than right portal veins. Multiple enlarged calcified left mesenteric nodes measuring up to 3.0 cm (series 2/image 61). Bulky calcified 4.1 cm central mesenteric node (series 2/image 54). Reproductive: Mildly enlarged prostate. Other: No pneumoperitoneum, ascites or focal fluid collection. Musculoskeletal: No aggressive  appearing focal osseous lesions. Mild thoracolumbar spondylosis. IMPRESSION: 1. Irregular annular wall thickening in the proximal sigmoid colon measuring 5.5 cm in length, compatible with primary sigmoid colon carcinoma. 2. 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 3. Bulky hepatic metastatic disease involving much of the liver. 4. Solid 6 mm right lower lobe pulmonary nodule, cannot exclude pulmonary metastasis. Right pericardiophrenic adenopathy suspicious for mediastinal nodal metastasis. Recommend chest CT for further staging evaluation. 5. Nonspecific mild diffuse bladder wall thickening, probably due to chronic bladder outlet obstruction by the mildly enlarged prostate. No hydronephrosis. 6. Cholelithiasis. Electronically Signed   By: Ilona Sorrel M.D.   On: 07/14/2017 19:09    Scheduled Meds: . pantoprazole  40 mg Oral BID   Continuous Infusions:   Assessment/Plan:  1. 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  Status post biopsy result pending, some bleeding noted will monitor repeat CBC in the morning if doing well can be discharged with outpatient oncology follow-up 2. Acute blood loss anemia. EGD showing congestive mucosa in the gastric body and multiple duodenal polyps.  Patient given IV Venofer yesterday.  Code Status:     Code Status Orders  (From admission, onward)        Start     Ordered   07/13/17 1737  Full code  Continuous     07/13/17 1736    Code Status History    This patient has a current code status but no historical code status.     Family Communication: Spoke with family at the bedside Disposition Plan: Potential disposition after colonoscopy tomorrow depending on patient's condition.  Consultants:  Gastroenterology  Oncology  Procedures:  Endoscopy  Time spent: 30 minutes spent [employer]

## 2017-07-16 NOTE — Anesthesia Procedure Notes (Signed)
Date/Time: 07/16/2017 9:39 AM Performed by: Johnna Acosta, CRNA Pre-anesthesia Checklist: Patient identified, Emergency Drugs available, Suction available, Patient being monitored and Timeout performed Patient Re-evaluated:Patient Re-evaluated prior to induction Oxygen Delivery Method: Nasal cannula Preoxygenation: Pre-oxygenation with 100% oxygen

## 2017-07-17 LAB — CBC
HEMATOCRIT: 26.6 % — AB (ref 40.0–52.0)
Hemoglobin: 8.8 g/dL — ABNORMAL LOW (ref 13.0–18.0)
MCH: 25.7 pg — ABNORMAL LOW (ref 26.0–34.0)
MCHC: 33.1 g/dL (ref 32.0–36.0)
MCV: 77.8 fL — AB (ref 80.0–100.0)
Platelets: 252 10*3/uL (ref 150–440)
RBC: 3.42 MIL/uL — ABNORMAL LOW (ref 4.40–5.90)
RDW: 17.2 % — AB (ref 11.5–14.5)
WBC: 7.4 10*3/uL (ref 3.8–10.6)

## 2017-07-17 LAB — BASIC METABOLIC PANEL
ANION GAP: 6 (ref 5–15)
BUN: 8 mg/dL (ref 6–20)
CALCIUM: 8.7 mg/dL — AB (ref 8.9–10.3)
CO2: 24 mmol/L (ref 22–32)
Chloride: 106 mmol/L (ref 98–111)
Creatinine, Ser: 0.75 mg/dL (ref 0.61–1.24)
GFR calc Af Amer: >60 mL/min (ref >60)
GFR calc non Af Amer: >60 mL/min (ref >60)
GLUCOSE: 80 mg/dL (ref 70–99)
POTASSIUM: 3.5 mmol/L (ref 3.5–5.1)
Sodium: 136 mmol/L (ref 135–145)

## 2017-07-17 MED ORDER — MORPHINE SULFATE 15 MG PO TABS: 15.0000 mg | ORAL_TABLET | Freq: Four times a day (QID) | ORAL | 0 refills | Status: AC | PRN

## 2017-07-17 NOTE — Progress Notes (Signed)
Discharge instructions given and went over with patient at bedside. Prescriptions given and reviewed. Follow-up appointments reviewed. All questions answered. Patient discharged home. Madlyn Frankel, RN

## 2017-07-17 NOTE — Discharge Summary (Signed)
[facility] - [place] [facility], [place] y.o., DOB September 13, 1965, MRN [id number]. Admission date: 07/13/2017 Discharge Date 07/17/2017 Primary MD Katheren Shams Admitting Physician Dustin Flock, MD  Admission Diagnosis  UGIB (upper gastrointestinal bleed) [K92.2] Anemia, unspecified type [D64.9]  Discharge Diagnosis   Active Problems:  gi bleed due colon cancer Metastatic lesions to the liver Hyperlipidemia    Hospital Course   : Omar Young  is a 52 y.o. male with a known history of hyperlipidemia Who is presenting to the emergency room with complaint of pain in the lower part of his chest in the substernal region.    Patient was noted to have guaiac positive stools as well as anemia.  He was admitted and underwent a EGD which showed some gastric mucosa edema.  Therefore patient had a CT of the abdomen which showed lesions in the liver consistent with metastatic disease.  Patient was seen by GI and underwent a colonoscopy which confirmed a sigmoid mass.  She was also seen by oncology.  He had biopsy of the mass.  Results pending.  He will follow-up with oncology to start treatment.            Consults  GI,   Significant Tests:  See full reports for all details    Dg Chest 2 View  Result Date: 07/13/2017 CLINICAL DATA:  Chest pain since last night. EXAM: CHEST - 2 VIEW COMPARISON:  None. FINDINGS: The heart size and mediastinal contours are within normal limits. Both lungs are clear. The visualized skeletal structures are unremarkable. IMPRESSION: No active cardiopulmonary disease. Electronically Signed   By: Abelardo Diesel M.D.   On: 07/13/2017 15:03   Ct Chest Wo Contrast  Result Date: 07/16/2017 CLINICAL DATA:  Patient with pulmonary nodule visualized on prior abdomen pelvic CT. Recent diagnosis colon cancer. EXAM: CT CHEST WITHOUT CONTRAST TECHNIQUE: Multidetector CT imaging of the chest was performed following the standard protocol  without IV contrast. COMPARISON:  CT abdomen pelvis 07/14/2017; chest radiograph 07/13/2017. FINDINGS: Cardiovascular: Heart is mildly enlarged. No pericardial effusion. Thoracic aortic vascular calcifications. Mediastinum/Nodes: 1.2 cm pretracheal lymph node (image 36; series 2). 1.8 cm right pericardiophrenic lymph node (image 99; series 2). There is a 1.2 cm right internal mammary lymph node (image 54; series 2). No axillary adenopathy. Esophagus is normal in appearance. Lungs/Pleura: Central airways are patent. Patchy consolidative opacities within the lower lobes bilaterally. 6 mm right lower lobe nodule (image 113; series 3). 3 mm right upper lobe nodule (image 53; series 3). 4 mm subpleural right upper lobe nodule (image 36; series 3). 5 mm right upper lobe nodule (image 44; series 3). 4 mm right upper lobe nodule (image 51; series 3). 3 mm left lower lobe nodule (image 77; series 3). Patchy consolidation within the right middle lobe. No pleural effusion or pneumothorax. Upper Abdomen: Partially calcified large mass within the liver, better visualized on contrast-enhanced abdomen pelvic CT 07/14/2017. Musculoskeletal: Thoracic spine degenerative changes. No aggressive or acute appearing osseous lesions. IMPRESSION: 1. Scattered bilateral pulmonary nodules as above, nonspecific in etiology. Metastatic disease not excluded. Recommend attention on follow-up. 2. Patchy consolidation within the right middle, right lower and left lower lobes which may represent atelectasis. 3. Enlarged right pericardiophrenic lymph node, right internal mammary node and pretracheal lymph node, raising the possibility of nodal metastatic disease. 4. Poorly visualized large mass within the liver without intravenous contrast material. See dedicated CT abdomen pelvis report 07/14/2017. 5. Aortic Atherosclerosis (ICD10-I70.0). Electronically Signed  By: Lovey Newcomer M.D.   On: 07/16/2017 11:13   Ct Abdomen Pelvis W Contrast  Result  Date: 07/14/2017 CLINICAL DATA:  Inpatient. Progressive weight loss of 35 pounds over the prior 8 months. Early satiety. Epigastric abdominal pain. Anemia. EXAM: CT ABDOMEN AND PELVIS WITH CONTRAST TECHNIQUE: Multidetector CT imaging of the abdomen and pelvis was performed using the standard protocol following bolus administration of intravenous contrast. CONTRAST:  162mL ISOVUE-300 IOPAMIDOL (ISOVUE-300) INJECTION 61% COMPARISON:  None. FINDINGS: Lower chest: Right lower lobe 6 mm solid pulmonary nodule (series 4/image 9). Bandlike scarring versus atelectasis in dependent basilar right lower lobe. Enlarged 1.9 cm right pericardiophrenic node (series 7/image 5). Hepatobiliary: There are multiple (greater than 5) heterogeneously enhancing liver masses involving much of the liver. There is a dominant large 17.8 x 13.2 cm liver mass centered in the left liver lobe with involvement of right and left liver lobes (series 2/image 23), with central heterogeneous calcification. Additional representative 6.2 x 5.8 cm right liver dome (series 2/image 7) and posterior right liver lobe 4.3 x 3.6 cm (series 2/image 19) masses. Cholelithiasis. Contracted gallbladder. No definite gallbladder wall thickening. No pericholecystic fluid. There is intrahepatic biliary ductal dilatation in the left liver lobe peripheral to the dominant liver mass. Normal caliber common bile duct. Pancreas: Normal, with no mass or duct dilation. Spleen: Normal size. No mass. Adrenals/Urinary Tract: Normal adrenals. A few scattered subcentimeter hypodense renal cortical lesions in both kidneys are too small to characterize. No hydronephrosis. Nondistended bladder with suggestion of mild diffuse bladder wall thickening. Stomach/Bowel: Normal non-distended stomach. Normal caliber small bowel with no small bowel wall thickening. Normal appendix. Irregular annular wall thickening in the proximal sigmoid colon measuring 5.5 cm in length (series 2/image 63). No  additional sites of large bowel wall thickening. Vascular/Lymphatic: Normal caliber abdominal aorta. Patent portal, splenic, hepatic and renal veins. Extrinsic mass-effect on the bifurcation of the main portal vein and left greater than right portal veins. Multiple enlarged calcified left mesenteric nodes measuring up to 3.0 cm (series 2/image 61). Bulky calcified 4.1 cm central mesenteric node (series 2/image 54). Reproductive: Mildly enlarged prostate. Other: No pneumoperitoneum, ascites or focal fluid collection. Musculoskeletal: No aggressive appearing focal osseous lesions. Mild thoracolumbar spondylosis. IMPRESSION: 1. Irregular annular wall thickening in the proximal sigmoid colon measuring 5.5 cm in length, compatible with primary sigmoid colon carcinoma. 2. 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 3. Bulky hepatic metastatic disease involving much of the liver. 4. Solid 6 mm right lower lobe pulmonary nodule, cannot exclude pulmonary metastasis. Right pericardiophrenic adenopathy suspicious for mediastinal nodal metastasis. Recommend chest CT for further staging evaluation. 5. Nonspecific mild diffuse bladder wall thickening, probably due to chronic bladder outlet obstruction by the mildly enlarged prostate. No hydronephrosis. 6. Cholelithiasis. Electronically Signed   By: Ilona Sorrel M.D.   On: 07/14/2017 19:09       Today   Subjective:   [place] Nurse patient feeling well wants to go home  Objective:   Blood pressure 126/80, pulse 73, temperature 98.8 F (37.1 C), temperature source Oral, resp. rate 19, height 5\' 10"  (1.778 m), weight 108.9 kg (240 lb), SpO2 99 %.  . No intake or output data in the 24 hours ending 07/17/17 1138  Exam VITAL SIGNS: Blood pressure 126/80, pulse 73, temperature 98.8 F (37.1 C), temperature source Oral, resp. rate 19, height 5\' 10"  (1.778 m), weight 108.9 kg (240 lb),  SpO2 99 %.  GENERAL:  52 y.o.-year-old patient lying in the bed with no acute distress.  EYES: Pupils equal, round, reactive to light and accommodation. No scleral icterus. Extraocular muscles intact.  HEENT: Head atraumatic, normocephalic. Oropharynx and nasopharynx clear.  NECK:  Supple, no jugular venous distention. No thyroid enlargement, no tenderness.  LUNGS: Normal breath sounds bilaterally, no wheezing, rales,rhonchi or crepitation. No use of accessory muscles of respiration.  CARDIOVASCULAR: S1, S2 normal. No murmurs, rubs, or gallops.  ABDOMEN: Soft, nontender, nondistended. Bowel sounds present. No organomegaly or mass.  EXTREMITIES: No pedal edema, cyanosis, or clubbing.  NEUROLOGIC: Cranial nerves II through XII are intact. Muscle strength 5/5 in all extremities. Sensation intact. Gait not checked.  PSYCHIATRIC: The patient is alert and oriented x 3.  SKIN: No obvious rash, lesion, or ulcer.   Data Review     CBC w Diff:  Lab Results  Component Value Date   WBC 7.4 07/17/2017   HGB 8.8 (L) 07/17/2017   HCT 26.6 (L) 07/17/2017   PLT 252 07/17/2017   CMP:  Lab Results  Component Value Date   NA 136 07/17/2017   K 3.5 07/17/2017   CL 106 07/17/2017   CO2 24 07/17/2017   BUN 8 07/17/2017   CREATININE 0.75 07/17/2017   PROT 8.3 (H) 07/13/2017   ALBUMIN 3.6 07/13/2017   BILITOT 0.8 07/13/2017   ALKPHOS 190 (H) 07/13/2017   AST 36 07/13/2017   ALT 19 07/13/2017  .  Micro Results No results found for this or any previous visit (from the past 240 hour(s)).      Code Status Orders  (From admission, onward)        Start     Ordered   07/13/17 1737  Full code  Continuous     07/13/17 1736    Code Status History    This patient has a current code status but no historical code status.          Follow-up Clifton, MD Follow up in 2 week(s).   Specialty:  Gastroenterology Contact information: [place] [address] [phone]        Dion Body, MD Follow up in 6 day(s).   Specialty:  Family Medicine Contact information: [place] [address] [phone]        Lequita Asal, MD Follow up on 07/20/2017.   Specialty:  Hematology and Oncology Why:  hospital f/u colon cancer s/p bx Contact information: [place] [address] [phone]           Discharge Medications   Allergies as of 07/17/2017      Reactions   Codeine    Headache       Medication List    TAKE these medications   morphine 15 MG tablet Commonly known as:  MSIR Take 1 tablet (15 mg total) by mouth every 6 (six) hours as needed for severe pain.   ondansetron 4 MG tablet Commonly known as:  ZOFRAN Take 1 tablet (4 mg total) by mouth every 6 (six) hours as needed for nausea.   pantoprazole 40 MG tablet Commonly known as:  PROTONIX Take 1 tablet (40 mg total) by mouth 2 (two) times daily.          Total Time in preparing paper work, data evaluation and todays exam - 41 minutes  Dustin Flock M.D on 07/17/2017 at 11:38 AM [facility]  Office  [phone]

## 2017-07-18 ENCOUNTER — Encounter: Payer: Self-pay | Admitting: Gastroenterology

## 2017-07-18 LAB — SURGICAL PATHOLOGY

## 2017-07-19 ENCOUNTER — Other Ambulatory Visit: Payer: Self-pay

## 2017-07-19 ENCOUNTER — Telehealth: Payer: Self-pay

## 2017-07-19 LAB — SURGICAL PATHOLOGY

## 2017-07-19 NOTE — Telephone Encounter (Signed)
Pathology from lab at [facility] calls to report colon mass at 60cm at least, intramucousal adenocarcinoma. Dr. Bonna Gains aware. This was read back to lab.

## 2017-07-20 ENCOUNTER — Inpatient Hospital Stay: Payer: BLUE CROSS/BLUE SHIELD | Attending: Hematology and Oncology | Admitting: Hematology and Oncology

## 2017-07-20 ENCOUNTER — Other Ambulatory Visit: Payer: Self-pay | Admitting: Hematology and Oncology

## 2017-07-20 ENCOUNTER — Encounter: Payer: Self-pay | Admitting: Hematology and Oncology

## 2017-07-20 ENCOUNTER — Telehealth: Payer: Self-pay | Admitting: Urgent Care

## 2017-07-20 ENCOUNTER — Other Ambulatory Visit: Payer: BLUE CROSS/BLUE SHIELD

## 2017-07-20 VITALS — BP 122/81 | HR 71 | Temp 98.1°F | Resp 18 | Ht 70.0 in | Wt 236.1 lb

## 2017-07-20 DIAGNOSIS — R599 Enlarged lymph nodes, unspecified: Secondary | ICD-10-CM | POA: Diagnosis not present

## 2017-07-20 DIAGNOSIS — C187 Malignant neoplasm of sigmoid colon: Secondary | ICD-10-CM

## 2017-07-20 DIAGNOSIS — K317 Polyp of stomach and duodenum: Secondary | ICD-10-CM | POA: Diagnosis not present

## 2017-07-20 DIAGNOSIS — D509 Iron deficiency anemia, unspecified: Secondary | ICD-10-CM

## 2017-07-20 DIAGNOSIS — K769 Liver disease, unspecified: Secondary | ICD-10-CM | POA: Diagnosis not present

## 2017-07-20 DIAGNOSIS — R911 Solitary pulmonary nodule: Secondary | ICD-10-CM

## 2017-07-20 DIAGNOSIS — K2970 Gastritis, unspecified, without bleeding: Secondary | ICD-10-CM

## 2017-07-20 DIAGNOSIS — R634 Abnormal weight loss: Secondary | ICD-10-CM | POA: Diagnosis not present

## 2017-07-20 DIAGNOSIS — D500 Iron deficiency anemia secondary to blood loss (chronic): Secondary | ICD-10-CM

## 2017-07-20 DIAGNOSIS — K625 Hemorrhage of anus and rectum: Secondary | ICD-10-CM

## 2017-07-20 DIAGNOSIS — B9681 Helicobacter pylori [H. pylori] as the cause of diseases classified elsewhere: Secondary | ICD-10-CM

## 2017-07-20 DIAGNOSIS — Z809 Family history of malignant neoplasm, unspecified: Secondary | ICD-10-CM

## 2017-07-20 DIAGNOSIS — D490 Neoplasm of unspecified behavior of digestive system: Secondary | ICD-10-CM

## 2017-07-20 DIAGNOSIS — R160 Hepatomegaly, not elsewhere classified: Secondary | ICD-10-CM

## 2017-07-20 DIAGNOSIS — Z807 Family history of other malignant neoplasms of lymphoid, hematopoietic and related tissues: Secondary | ICD-10-CM

## 2017-07-20 DIAGNOSIS — R918 Other nonspecific abnormal finding of lung field: Secondary | ICD-10-CM

## 2017-07-20 LAB — CBC WITH DIFFERENTIAL/PLATELET
Basophils Absolute: 0 10*3/uL (ref 0–0.1)
Basophils Relative: 1 %
Eosinophils Absolute: 0 10*3/uL (ref 0–0.7)
Eosinophils Relative: 1 %
HCT: 28 % — ABNORMAL LOW (ref 40.0–52.0)
Hemoglobin: 8.9 g/dL — ABNORMAL LOW (ref 13.0–18.0)
Lymphocytes Relative: 17 %
Lymphs Abs: 1.3 10*3/uL (ref 1.0–3.6)
MCH: 25.2 pg — ABNORMAL LOW (ref 26.0–34.0)
MCHC: 32 g/dL (ref 32.0–36.0)
MCV: 78.8 fL — ABNORMAL LOW (ref 80.0–100.0)
Monocytes Absolute: 0.9 10*3/uL (ref 0.2–1.0)
Monocytes Relative: 12 %
Neutro Abs: 5.5 10*3/uL (ref 1.4–6.5)
Neutrophils Relative %: 69 %
Platelets: 315 10*3/uL (ref 150–440)
RBC: 3.55 MIL/uL — ABNORMAL LOW (ref 4.40–5.90)
RDW: 17.8 % — ABNORMAL HIGH (ref 11.5–14.5)
WBC: 7.8 10*3/uL (ref 3.8–10.6)

## 2017-07-20 NOTE — Progress Notes (Signed)
[place] [facility] day:  07/20/2017  Chief Complaint: Omar Young is a 52 y.o. male with probable metastatic colon cancer who is seen for assessment after recent hospitalization.  HPI: The patient was admitted to [facility] from 07/13/2017 - 07/17/2017 with a GI bleed.  He described a 1 week history of abdominal pain.  He denied any nausea or vomiting.  He denied melena or hematochezia, although he comments that he "doesn't look".  He had lost 30 pounds in the past year unintentionally.  He denied any prior colonoscopy.  He denied any family history of colon cancer.  He presented to [facility] ER on 07/13/2017 with chest pain.  Pain localized to the epigastric region.  EKG and CXR were negative.  Hemoglobin was 9.8 with an MCV of 78.2. Review of prior CBCs revealed a hemoglobin 12.5 and MCV 87.4 on 07/19/2016.  Stool was guaiac positive.    He was seen by Dr. Olean Ree.  EGD on 07/14/2017 revealed a normal esophagus, congested mucosa in the gastric body, and multiple duodenal polyps. Biopsies were taken.  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  Gastric biopsy revealed moderate chronic active H pylori associated gastritis.  There was no dysplasia or malignancy.   Abdomen and pelvic CT on 07/14/2017 revealed an irregular 5.5 cm annular wall thickening in the proximal sigmoid colon compatible with primary sigmoid colon carcinoma.  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  There was bulky hepatic metastatic disease involving much of the liver.  There were > 5 enhancing liver lesions (17.8 x 13.2 cm centered in the left liver; 6.2 x 5.8 cm right liver dome and 4.3 x 3.6 cm posterior right liver).  There was a  solid 6 mm right lower lobe pulmonary nodule, cannot exclude pulmonary metastasis.  There was a 1.9 cm right  pericardiophrenic adenopathy suspicious for mediastinal nodal metastasis. There was nonspecific mild diffuse bladder wall thickening, probably due to chronic bladder outlet obstruction by the mildly enlarged prostate.  There was cholelithiasis.  Colonoscopy on 07/16/2017 by Dr. Vonda Antigua revealed an infiltrative and ulcerated non-obstructing large mass was found at 60 cm proximal to the anus. The mass was circumferential, ulcerated, and friable.  Attempt to gently advance past the site lead to oozing from the mass.  It was not considered safe to advance the colonoscope past this site.  There was a 6 mm polyp in the sigmoid colon (tubular adenoma).  Pathology of the colon mass revealed at least intramucosal adenocarcinoma.    Chest CT on 07/16/2017 revealed scattered bilateral pulmonary nodules, nonspecific.  There was patchy consolidation within the right middle, right lower and left lower lobes which may represent atelectasis.  There was enlarged right pericardiophrenic lymph node, right internal mammary node and pretracheal lymph node, raising the possibility of nodal metastatic disease.  CBC on 07/14/2017 revealed a hematocrit of 27.1, hemoglobin 8.9, and MCV 77.9.  He received Venofer on 07/15/2017.  Hemoglobin was 8.8 on 07/17/2017.  Symptomatically, he denies any complaints.  He states that he is having a little rectal bleeding (about a tablespoon/day).  He denies any pain, nausea or vomiting.  His father had lymphoma at age 62. His paternal grandfather had "cancer".   Past Medical History:  Diagnosis Date  . Hyperlipemia     Past Surgical History:  Procedure Laterality Date  . COLONOSCOPY WITH PROPOFOL N/A 07/16/2017  Procedure: COLONOSCOPY WITH PROPOFOL;  Surgeon: Virgel Manifold, MD;  Location: [facility] ENDOSCOPY;  Service: Endoscopy;  Laterality: N/A;  . ESOPHAGOGASTRODUODENOSCOPY (EGD) WITH PROPOFOL N/A 07/14/2017   Procedure: ESOPHAGOGASTRODUODENOSCOPY (EGD) WITH  PROPOFOL;  Surgeon: Toledo, Benay Pike, MD;  Location: [facility] ENDOSCOPY;  Service: Gastroenterology;  Laterality: N/A;  . none      Family History  Problem Relation Age of Onset  . Diabetes Mother   . Hypertension Father   . Cancer Father   . Cancer Paternal Aunt     Social History:  reports that he has never smoked. He has never used smokeless tobacco. He reports that he drank alcohol. He reports that he does not use drugs.  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  He lives in [place].  He is accompanied by his wife, Omar Young.  Allergies:  Allergies  Allergen Reactions  . Codeine     Headache     Current Medications: Current Outpatient Medications  Medication Sig Dispense Refill  . morphine (MSIR) 15 MG tablet Take 1 tablet (15 mg total) by mouth every 6 (six) hours as needed for severe pain. 30 tablet 0  . ondansetron (ZOFRAN) 4 MG tablet Take 1 tablet (4 mg total) by mouth every 6 (six) hours as needed for nausea. 20 tablet 0  . pantoprazole (PROTONIX) 40 MG tablet Take 1 tablet (40 mg total) by mouth 2 (two) times daily. 60 tablet 0  . clarithromycin (BIAXIN) 500 MG tablet Take 500 mg by mouth 2 (two) times daily.  0  . metroNIDAZOLE (FLAGYL) 250 MG tablet Take 250 mg by mouth 4 (four) times daily.  0   No current facility-administered medications for this visit.     Review of Systems:  GENERAL:  Feels good.  No fevers, sweats.  Weight loss of 30 pounds in the past year. PERFORMANCE STATUS (ECOG):  1 HEENT:  No visual changes, runny nose, sore throat, mouth sores or tenderness. Lungs: No shortness of breath or cough.  No hemoptysis. Cardiac:  No chest pain, palpitations, orthopnea, or PND. GI:  No nausea, vomiting, diarrhea, constipation, melena.  Hematochezia (a tablespoon/day). GU:  No urgency, frequency, dysuria, or hematuria. Musculoskeletal:  No back pain.  No joint pain.  No muscle tenderness. Extremities:  No pain or swelling. Skin:  No rashes or skin  changes. Neuro:  No headache, numbness or weakness, balance or coordination issues. Endocrine:  No diabetes, thyroid issues, hot flashes or night sweats. Psych:  No mood changes, depression or anxiety. Pain:  No focal pain. Review of systems:  All other systems reviewed and found to be negative.   Physical Exam: Blood pressure 122/81, pulse 71, temperature 98.1 F (36.7 C), temperature source Tympanic, resp. rate 18, height _0  (1.778 m), weight 236 lb 1.8 oz (107.1 kg). GENERAL:  Well developed, well nourished, gentleman sitting comfortably in the exam room in no acute distress. MENTAL STATUS:  Alert and oriented to person, place and time. HEAD:  Short dark hair.  Normocephalic, atraumatic, face symmetric, no Cushingoid features. EYES:  [place]wn eyes.  Pupils equal round and reactive to light and accomodation.  No conjunctivitis or scleral icterus. ENT:  Oropharynx clear without lesion.  Tongue normal. Mucous membranes moist.  RESPIRATORY:  Clear to auscultation without rales, wheezes or rhonchi. CARDIOVASCULAR:  Regular rate and rhythm without murmur, rub or gallop. ABDOMEN:  Soft, minimally tender in the LLQ without guarding or rebound tenderness. Active bowel sounds, and no hepatosplenomegaly.  No masses. SKIN:  No rashes, ulcers or lesions. EXTREMITIES: No edema, no skin discoloration or tenderness.  No palpable cords. LYMPH NODES: No palpable cervical, supraclavicular, axillary or inguinal adenopathy  NEUROLOGICAL: Unremarkable. PSYCH:  Appropriate.   Office Visit on 07/20/2017  Component Date Value Ref Range Status  . WBC 07/20/2017 7.8  3.8 - 10.6 K/uL Final  . RBC 07/20/2017 3.55* 4.40 - 5.90 MIL/uL Final  . Hemoglobin 07/20/2017 8.9* 13.0 - 18.0 g/dL Final  . HCT 07/20/2017 28.0* 40.0 - 52.0 % Final  . MCV 07/20/2017 78.8* 80.0 - 100.0 fL Final  . MCH 07/20/2017 25.2* 26.0 - 34.0 pg Final  . MCHC 07/20/2017 32.0  32.0 - 36.0 g/dL Final  . RDW 07/20/2017 17.8* 11.5 - 14.5 %  Final  . Platelets 07/20/2017 315  150 - 440 K/uL Final  . Neutrophils Relative % 07/20/2017 69  % Final  . Neutro Abs 07/20/2017 5.5  1.4 - 6.5 K/uL Final  . Lymphocytes Relative 07/20/2017 17  % Final  . Lymphs Abs 07/20/2017 1.3  1.0 - 3.6 K/uL Final  . Monocytes Relative 07/20/2017 12  % Final  . Monocytes Absolute 07/20/2017 0.9  0.2 - 1.0 K/uL Final  . Eosinophils Relative 07/20/2017 1  % Final  . Eosinophils Absolute 07/20/2017 0.0  0 - 0.7 K/uL Final  . Basophils Relative 07/20/2017 1  % Final  . Basophils Absolute 07/20/2017 0.0  0 - 0.1 K/uL Final   Performed at [facility], [address], [place], [place] [address]    Assessment:  Omar Young is a 52 y.o. 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  He presented with iron deficiency anemia and weight loss.  CEA was 1403 on 07/15/2017.  Colonoscopy on 07/16/2017 revealed an infiltrative and ulcerated non-obstructing large mass was found at 60 cm proximal to the anus. The mass was circumferential, ulcerated, and friable.  Attempt to gently advance past the site lead to oozing from the mass (not safe).  There was a 6 mm polyp in the sigmoid colon (tubular adenoma).  Pathology of the colon mass revealed at least intramucosal adenocarcinoma.    EGD on 07/14/2017 revealed a normal esophagus, congested mucosa in the gastric body, and multiple duodenal polyps.  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  Gastric biopsy revealed moderate chronic active H pylori associated gastritis.  There was no dysplasia or malignancy.   Abdomen and pelvic CT on 07/14/2017 revealed an irregular 5.5 cm annular wall thickening in the proximal sigmoid colon compatible with primary sigmoid colon carcinoma.  There were a partially calcified bulky left and central mesenteric lymph nodes.  There was bulky hepatic metastatic disease involving much of the liver.  There were > 5 enhancing liver lesions  (17.8 x 13.2 cm centered in the left liver; 6.2 x 5.8 cm right liver dome and 4.3 x 3.6 cm posterior right liver).  There was a  solid 6 mm right lower lobe pulmonary nodule.  There was a 1.9 cm right pericardiophrenic lymph node.  Chest CT on 07/16/2017 revealed scattered bilateral pulmonary nodules, nonspecific.  There was patchy consolidation within the right middle, right lower and left lower lobes which may represent atelectasis.  There was enlarged right pericardiophrenic lymph node, right internal mammary node and pretracheal lymph node  He has iron defciency anemia.  CBC on 07/14/2017 revealed a hematocrit of 27.1, hemoglobin 8.9, and MCV 77.9.  Ferritin was 35.  He received Venofer on 07/15/2017.   Symptomatically, he is  doing well.  He has a small amount of rectal bleeding.  Hemoglobin is 8.9 (stable).  Plan:   1.  Review chest, abdomen, and pelvic CT.  Discuss mass in sigmoid colon, mesenteric adenopathy, liver lesions, and non-specific pulmonary nodules. 2.  Discuss results of colon biopsy.  At least intramuscular adenocarcinoma.  Findings are not representative of the lesion noted on imaging.  Discuss need for confirmation of metastatic colon cancer.  Discuss liver biopsy.  Biopsy to be sent for pathologic confirmation and molecular profiling (KRAS, NRAS, BRAF, MMR/MSI, etc). 3.  Discuss treatment of metastatic colon cancer.  Tumor is not resectable.  Discuss systemic treatment with FOLFOX/CAPEOX or FOLFIRI +/- Avastin or FOLFOX/CAPEOX or FOLFIRI +/- cetuximab or panitumumab.  Need to ensure KRAS/NRAS/BRAF is wild type.  Briefly discussed FOLFOXIRI.  Check Alpha gal IgE status to ensure no potential for allergic reaction to cetuximab given high risk of reaction in [place].  Administration of treatment and potential side effects reviewed in detail.  Discuss plan for port-a-cath placement. Discuss chemotherapy class. 4.  Discuss iron deficiency anemia.  Discuss oral versus IV iron.   Patient received IV iron in hospital. 5.  Discuss H pylori and treatment.  Will contact his gastroenterologist (Dr Olean Ree; [phone]) who made the diagnosis.  6.  Discuss patient's desire to be treated at [facility].  Contact Dr. Renda Rolls- done.  Anticipate liver biopsy at [facility].  Patient has appointment on 07/26/2017. 7.  Port-a-cath placement with Dr. Lucky Cowboy. 8.  Labs today:  CBC with diff, PT/INR, Alpha gal IgE. 9.  RTC as needed based on patient's upcoming chemotherapy plans at [facility]   Lequita Asal, MD  07/20/2017, 2:30 PM

## 2017-07-20 NOTE — Telephone Encounter (Signed)
Re: labs and referral  Called patient to discuss lab results and plan of care. Spoke with spouse Ulis Rias).   The following information was reviewed with patient's wife: 1. Hgb stable at 8.8. No need for blood transfusion that this time.  2. Patient to be seen at [facility] by Dr. Arlester Marker on 07/09 - referral being sent. Patient to expect call from [facility] to schedule appointment.  3. Discuss biopsy. [facility] provider asking that biopsy be done there so that special molecular testing can be performed without delay.  4. Discuss venous access. Patient will need port placed. He is in agreement. Referral sent to Dr. Bunnie Domino office for scheduling. Ideally, would like to have port placed on 07/05 or 07/08.  Patient was advised to return a call to the clinic with any questions or concerns. Ulis Rias has the number to the office and verbalizes understanding of directives thus far.   Honor Loh, MSN, APRN, FNP-C, CEN Oncology/Hematology Nurse Practitioner  [facility] 07/20/17, 2:52 PM

## 2017-07-20 NOTE — Progress Notes (Signed)
Patient here today as new evaluation regarding biopsy results in his colon.  Referred by Dr. Leslye Peer.  Patient is accompanined by his wife, Ulis Rias today.  Patient states he will be transferring his care to [facility].

## 2017-07-22 ENCOUNTER — Telehealth (INDEPENDENT_AMBULATORY_CARE_PROVIDER_SITE_OTHER): Payer: Self-pay

## 2017-07-22 ENCOUNTER — Telehealth: Payer: Self-pay | Admitting: *Deleted

## 2017-07-22 NOTE — Telephone Encounter (Signed)
  Thanks.    I knew he was having a port before Tuesday next week when he goes to [place], but did not know when.  M

## 2017-07-22 NOTE — Telephone Encounter (Signed)
Ms Stenseth called asking what time patient is to have his port put in today. They had heard nothing about it and was told he needed it today to be able to go to [facility]. I called [facility] and spoke with Mickel Baas who stated there is no doc at hospital today, the soonest would be on Monday. She advised time to arrive at [facility] is 11:15, NPO, needs a driver and he can take his morning medications with a sip of water. I conveyed this to Ulis Rias who repeated it back to me and agrees she will have him at registration by 11 on Monday

## 2017-07-22 NOTE — Telephone Encounter (Signed)
Hassan Rowan from the Cancer center called and was given the information regarding the patient's port placement for Monday with Dr. Lucky Cowboy. Arrival time of 11:15 am, NPO and someone with them.

## 2017-07-24 ENCOUNTER — Encounter: Payer: Self-pay | Admitting: Hematology and Oncology

## 2017-07-25 ENCOUNTER — Telehealth: Payer: Self-pay | Admitting: Licensed Clinical Social Worker

## 2017-07-25 ENCOUNTER — Ambulatory Visit
Admission: RE | Admit: 2017-07-25 | Discharge: 2017-07-25 | Disposition: A | Discharge status: Home / Self Care | Payer: BLUE CROSS/BLUE SHIELD | Source: Ambulatory Visit | Attending: Vascular Surgery | Admitting: Vascular Surgery

## 2017-07-25 ENCOUNTER — Other Ambulatory Visit (INDEPENDENT_AMBULATORY_CARE_PROVIDER_SITE_OTHER): Payer: Self-pay | Admitting: Vascular Surgery

## 2017-07-25 ENCOUNTER — Encounter
Admission: RE | Disposition: A | Discharge status: Home / Self Care | Payer: Self-pay | Source: Ambulatory Visit | Attending: Vascular Surgery

## 2017-07-25 DIAGNOSIS — B9681 Helicobacter pylori [H. pylori] as the cause of diseases classified elsewhere: Secondary | ICD-10-CM | POA: Insufficient documentation

## 2017-07-25 DIAGNOSIS — R918 Other nonspecific abnormal finding of lung field: Secondary | ICD-10-CM | POA: Diagnosis not present

## 2017-07-25 DIAGNOSIS — C189 Malignant neoplasm of colon, unspecified: Secondary | ICD-10-CM | POA: Insufficient documentation

## 2017-07-25 DIAGNOSIS — Z885 Allergy status to narcotic agent status: Secondary | ICD-10-CM | POA: Diagnosis not present

## 2017-07-25 DIAGNOSIS — Z9889 Other specified postprocedural states: Secondary | ICD-10-CM | POA: Insufficient documentation

## 2017-07-25 DIAGNOSIS — R160 Hepatomegaly, not elsewhere classified: Secondary | ICD-10-CM | POA: Diagnosis not present

## 2017-07-25 DIAGNOSIS — K2970 Gastritis, unspecified, without bleeding: Secondary | ICD-10-CM | POA: Diagnosis not present

## 2017-07-25 DIAGNOSIS — D500 Iron deficiency anemia secondary to blood loss (chronic): Secondary | ICD-10-CM | POA: Insufficient documentation

## 2017-07-25 DIAGNOSIS — Z79899 Other long term (current) drug therapy: Secondary | ICD-10-CM | POA: Diagnosis not present

## 2017-07-25 DIAGNOSIS — Z809 Family history of malignant neoplasm, unspecified: Secondary | ICD-10-CM | POA: Insufficient documentation

## 2017-07-25 DIAGNOSIS — E785 Hyperlipidemia, unspecified: Secondary | ICD-10-CM | POA: Insufficient documentation

## 2017-07-25 HISTORY — PX: PORTA CATH INSERTION: CATH118285

## 2017-07-25 SURGERY — PORTA CATH INSERTION
Anesthesia: Moderate Sedation

## 2017-07-25 MED ORDER — HEPARIN (PORCINE) IN NACL 1000-0.9 UT/500ML-% IV SOLN
INTRAVENOUS | Status: AC
  Filled 2017-07-25: qty 500

## 2017-07-25 MED ORDER — FENTANYL CITRATE (PF) 100 MCG/2ML IJ SOLN
INTRAMUSCULAR | Status: AC
  Filled 2017-07-25: qty 2

## 2017-07-25 MED ORDER — FENTANYL CITRATE (PF) 100 MCG/2ML IJ SOLN
INTRAMUSCULAR | Status: DC | PRN
  Administered 2017-07-25 (×2): 50 ug via INTRAVENOUS

## 2017-07-25 MED ORDER — SODIUM CHLORIDE 0.9 % IV SOLN
Freq: Once | INTRAVENOUS | Status: DC
  Filled 2017-07-25: qty 2

## 2017-07-25 MED ORDER — MIDAZOLAM HCL 2 MG/2ML IJ SOLN
INTRAMUSCULAR | Status: DC | PRN
  Administered 2017-07-25 (×2): 2 mg via INTRAVENOUS

## 2017-07-25 MED ORDER — ONDANSETRON HCL 4 MG/2ML IJ SOLN: 4.0000 mg | Freq: Four times a day (QID) | INTRAMUSCULAR | Status: DC | PRN

## 2017-07-25 MED ORDER — LIDOCAINE-EPINEPHRINE (PF) 1 %-1:200000 IJ SOLN
INTRAMUSCULAR | Status: AC
  Filled 2017-07-25: qty 30

## 2017-07-25 MED ORDER — SODIUM CHLORIDE 0.9 % IV SOLN
INTRAVENOUS | Status: DC
  Administered 2017-07-25: 12:00:00 via INTRAVENOUS

## 2017-07-25 MED ORDER — HYDROMORPHONE HCL 1 MG/ML IJ SOLN: 1.0000 mg | Freq: Once | INTRAMUSCULAR | Status: DC | PRN

## 2017-07-25 MED ORDER — CEFAZOLIN SODIUM-DEXTROSE 2-4 GM/100ML-% IV SOLN: 2.0000 g | Freq: Once | INTRAVENOUS | Status: DC

## 2017-07-25 MED ORDER — MIDAZOLAM HCL 5 MG/5ML IJ SOLN
INTRAMUSCULAR | Status: AC
  Filled 2017-07-25: qty 5

## 2017-07-25 SURGICAL SUPPLY — 8 items
KIT PORT POWER 8FR ISP CVUE (Port) ×3 IMPLANT
PACK ANGIOGRAPHY (CUSTOM PROCEDURE TRAY) ×3 IMPLANT
PAD GROUND ADULT SPLIT (MISCELLANEOUS) ×3 IMPLANT
PENCIL ELECTRO HAND CTR (MISCELLANEOUS) ×3 IMPLANT
SUT MNCRL AB 4-0 PS2 18 (SUTURE) ×3 IMPLANT
SUT PROLENE 0 CT 1 30 (SUTURE) ×3 IMPLANT
SUT VIC AB 3-0 SH 27 (SUTURE) ×2
SUT VIC AB 3-0 SH 27X BRD (SUTURE) ×1 IMPLANT

## 2017-07-25 NOTE — Op Note (Signed)
       VEIN AND VASCULAR SURGERY       Operative Note  Date: 07/25/2017  Preoperative diagnosis:  1. Colon cancer  Postoperative diagnosis:  Same as above  Procedures: #1. Ultrasound guidance for vascular access to the right internal jugular vein. #2. Fluoroscopic guidance for placement of catheter. #3. Placement of CT compatible Port-A-Cath, right internal jugular vein.  Surgeon: Leotis Pain, MD.   Anesthesia: Local with moderate conscious sedation for approximately 25  minutes using 4 mg of Versed and 100 mcg of Fentanyl  Fluoroscopy time: less than 1 minute  Contrast used: 0  Estimated blood loss: 5 cc  Indication for the procedure:  The patient is a 52 y.o.male with colon cancer.  The patient needs a Port-A-Cath for durable venous access, chemotherapy, lab draws, and CT scans. We are asked to place this. Risks and benefits were discussed and informed consent was obtained.  Description of procedure: The patient was brought to the vascular and interventional radiology suite.  Moderate conscious sedation was administered throughout the procedure during a face to face encounter with the patient with my supervision of the RN administering medicines and monitoring the patient's vital signs, pulse oximetry, telemetry and mental status throughout from the start of the procedure until the patient was taken to the recovery room. The right neck chest and shoulder were sterilely prepped and draped, and a sterile surgical field was created. Ultrasound was used to help visualize a patent right internal jugular vein. This was then accessed under direct ultrasound guidance without difficulty with the Seldinger needle and a permanent image was recorded. A J-wire was placed. After skin nick and dilatation, the peel-away sheath was then placed over the wire. I then anesthetized an area under the clavicle approximately 1-2 fingerbreadths. A transverse incision was created and an inferior pocket was  created with electrocautery and blunt dissection. The port was then brought onto the field, placed into the pocket and secured to the chest wall with 2 Prolene sutures. The catheter was connected to the port and tunneled from the subclavicular incision to the access site. Fluoroscopic guidance was then used to cut the catheter to an appropriate length. The catheter was then placed through the peel-away sheath and the peel-away sheath was removed. The catheter tip was parked in excellent location under fluorocoscopic guidance in the cavoatrial junction. The pocket was then irrigated with antibiotic impregnated saline and the wound was closed with a running 3-0 Vicryl and a 4-0 Monocryl. The access incision was closed with a single 4-0 Monocryl. The Huber needle was used to withdraw blood and flush the port with heparinized saline. Dermabond was then placed as a dressing. The patient tolerated the procedure well and was taken to the recovery room in stable condition.   Leotis Pain 07/25/2017 3:29 PM   This note was created with Dragon Medical transcription system. Any errors in dictation are purely unintentional.

## 2017-07-25 NOTE — Telephone Encounter (Signed)
EMMI flagged patient for answering yes to feeling sad/hopeless/anxious/empty. Clinical Education officer, museum (CSW) attempted to contact patient via telephone however he did not answer and a voicemail was left.   [employer], LCSW [phone]

## 2017-07-25 NOTE — H&P (Signed)
[facility] History & Physical Update  The patient was interviewed and re-examined.  The patient's previous History and Physical has been reviewed and is unchanged.  There is no change in the plan of care. We plan to proceed with the scheduled procedure.  Leotis Pain, MD  07/25/2017, 11:52 AM

## 2017-07-26 ENCOUNTER — Telehealth: Payer: Self-pay | Admitting: Licensed Clinical Social Worker

## 2017-07-26 ENCOUNTER — Encounter: Payer: Self-pay | Admitting: Vascular Surgery

## 2017-07-26 LAB — MISC LABCORP TEST (SEND OUT): Labcorp test code: 806562

## 2017-07-26 NOTE — Telephone Encounter (Signed)
EMMI flagged patient for answering yes to feeling sad/hopeless/anxious/empty. Clinical Education officer, museum (CSW) contacted patient for the second time via telephone and was able to reach him. Patient was very pleasant and thanked CSW for calling. Patient reported that he is doing fine and is going to [facility] for his cancer treatment. Patient reported that he has a very strong support system and he is not feeling sad or depressed. Patient reported that he did not mean to answer yes to that question. Patient reported no needs or concerns.   [employer], LCSW [phone]

## 2017-07-28 ENCOUNTER — Telehealth: Payer: Self-pay | Admitting: Gastroenterology

## 2017-07-28 NOTE — Telephone Encounter (Signed)
Spoke with pt to set up follow up apt pt states he transferred his care to [facility] did not want to schedule apt

## 2017-07-28 NOTE — Telephone Encounter (Signed)
Pam from [facility] left vm for Debbie or  Dr. Bonna Gains she states she has faxed over a form to be signed that allows slides to be released from Date of Service 07/18/2017 Dr. Carlyon Shadow was the  Surgeon that day. Pease call [phone] for any questions

## 2017-08-11 ENCOUNTER — Emergency Department: Payer: BLUE CROSS/BLUE SHIELD

## 2017-08-11 ENCOUNTER — Other Ambulatory Visit: Payer: Self-pay

## 2017-08-11 ENCOUNTER — Emergency Department
Admission: EM | Admit: 2017-08-11 | Discharge: 2017-08-11 | Disposition: A | Discharge status: Home / Self Care | Payer: BLUE CROSS/BLUE SHIELD | Attending: Emergency Medicine | Admitting: Emergency Medicine

## 2017-08-11 DIAGNOSIS — R079 Chest pain, unspecified: Secondary | ICD-10-CM | POA: Diagnosis present

## 2017-08-11 DIAGNOSIS — Z79899 Other long term (current) drug therapy: Secondary | ICD-10-CM | POA: Insufficient documentation

## 2017-08-11 HISTORY — DX: Malignant (primary) neoplasm, unspecified: C80.1

## 2017-08-11 LAB — COMPREHENSIVE METABOLIC PANEL
ALT: 27 U/L (ref 0–44)
ANION GAP: 8 (ref 5–15)
AST: 42 U/L — ABNORMAL HIGH (ref 15–41)
Albumin: 3.2 g/dL — ABNORMAL LOW (ref 3.5–5.0)
Alkaline Phosphatase: 369 U/L — ABNORMAL HIGH (ref 38–126)
BUN: 12 mg/dL (ref 6–20)
CHLORIDE: 105 mmol/L (ref 98–111)
CO2: 23 mmol/L (ref 22–32)
CREATININE: 0.93 mg/dL (ref 0.61–1.24)
Calcium: 8.6 mg/dL — ABNORMAL LOW (ref 8.9–10.3)
GFR calc non Af Amer: >60 mL/min (ref >60)
Glucose, Bld: 112 mg/dL — ABNORMAL HIGH (ref 70–99)
POTASSIUM: 3.7 mmol/L (ref 3.5–5.1)
SODIUM: 136 mmol/L (ref 135–145)
Total Bilirubin: 1.1 mg/dL (ref 0.3–1.2)
Total Protein: 8.9 g/dL — ABNORMAL HIGH (ref 6.5–8.1)

## 2017-08-11 LAB — TROPONIN I: Troponin I: <0.03 ng/mL (ref <0.03)

## 2017-08-11 LAB — CBC
HCT: 26.7 % — ABNORMAL LOW (ref 40.0–52.0)
Hemoglobin: 8.9 g/dL — ABNORMAL LOW (ref 13.0–18.0)
MCH: 25.8 pg — ABNORMAL LOW (ref 26.0–34.0)
MCHC: 33.4 g/dL (ref 32.0–36.0)
MCV: 77.1 fL — ABNORMAL LOW (ref 80.0–100.0)
Platelets: 287 10*3/uL (ref 150–440)
RBC: 3.46 MIL/uL — ABNORMAL LOW (ref 4.40–5.90)
RDW: 18.9 % — ABNORMAL HIGH (ref 11.5–14.5)
WBC: 8.9 10*3/uL (ref 3.8–10.6)

## 2017-08-11 LAB — FIBRIN DERIVATIVES D-DIMER (ARMC ONLY): Fibrin derivatives D-dimer (ARMC): 4067.79 ng{FEU}/mL — ABNORMAL HIGH (ref 0.00–499.00)

## 2017-08-11 MED ORDER — IOPAMIDOL (ISOVUE-370) INJECTION 76%
75.0000 mL | Freq: Once | INTRAVENOUS | Status: AC | PRN
  Administered 2017-08-11: 75 mL via INTRAVENOUS

## 2017-08-11 NOTE — ED Notes (Signed)
Warm socks, blankets provided to spouse. Warm blanket provided to pt.

## 2017-08-11 NOTE — ED Notes (Signed)
Report to megan, rn.  

## 2017-08-11 NOTE — ED Notes (Signed)
Pt updated on results of lab status. tv remote provided.

## 2017-08-11 NOTE — ED Triage Notes (Signed)
Pt in with co left arm pain states to axilla radiating down left torso.

## 2017-08-11 NOTE — ED Notes (Signed)
NAD noted at time of D/C. Pt ambulatory to lobby with SO. Pt refused wheelchair.

## 2017-08-11 NOTE — ED Notes (Signed)
Patient transported to CT 

## 2017-08-11 NOTE — ED Notes (Signed)
This RN and Jinny Blossom, RN to bedside to introduce self to patient and SO, pt given coke per his request. Explained waiting for labs to result. Pt states understanding. Will continue to monitor for further patient needs.

## 2017-08-11 NOTE — ED Notes (Signed)
Pt reports that he has a portacath in place and inquires over whether bloodwork can be performed from it; will await placement in exam room prior to accessing for labs; pt voices understanding

## 2017-08-11 NOTE — ED Notes (Signed)
Pt states left axilla pain x3 sharp and shooting since 1900 last pm. Pt states has a large shooting  pain that comes then goes, however pt states he does have pain that is less intense but continues to feel shooting as well. Pt denies shob, nausea. Pt denies pain increase with movement or exertion. Pt states pain began when he was eating last pm. Pt appears in no acute distress. Pt recently had a right sided chest wall port a cath placed 1-2 weeks pta that has not been accessed yet.

## 2017-08-13 NOTE — ED Provider Notes (Signed)
[facility] Emergency Department Provider Note    First MD Initiated Contact with Patient 08/11/17 [phone]     (approximate)  I have reviewed the triage vital signs and the nursing notes.   HISTORY  Chief Complaint Chest Pain   HPI Omar Young is a 52 y.o. male with below list of chronic medical conditions including recently diagnosed metastatic colon cancer presents to the emergency department with acute onset of left axilla pain radiating down the lateral left chest and arm.  Patient states that he had 3 distinct episodes that were sharp in nature and brief.  Patient has no pain at present.  Patient denies any accompanying shortness of breath nausea vomiting or diaphoresis.  Patient denies any dizziness.   Past Medical History:  Diagnosis Date  . Cancer [facility])    Liver & colon Cancer.  therapy treatment not started as of 08-11-17.  Marland Kitchen Hyperlipemia     Patient Active Problem List   Diagnosis Date Noted  . Helicobacter pylori gastritis 07/20/2017  . Pulmonary nodules 07/20/2017  . Anemia   . Colonic neoplasm   . Polyp of sigmoid colon   . Abnormal computed tomography of large intestine   . Liver mass   . GI bleed 07/13/2017    Past Surgical History:  Procedure Laterality Date  . COLONOSCOPY WITH PROPOFOL N/A 07/16/2017   Procedure: COLONOSCOPY WITH PROPOFOL;  Surgeon: Virgel Manifold, MD;  Location: [facility] ENDOSCOPY;  Service: Endoscopy;  Laterality: N/A;  . ESOPHAGOGASTRODUODENOSCOPY (EGD) WITH PROPOFOL N/A 07/14/2017   Procedure: ESOPHAGOGASTRODUODENOSCOPY (EGD) WITH PROPOFOL;  Surgeon: Toledo, Benay Pike, MD;  Location: [facility] ENDOSCOPY;  Service: Gastroenterology;  Laterality: N/A;  . none    . PORTA CATH INSERTION N/A 07/25/2017   Procedure: PORTA CATH INSERTION;  Surgeon: Algernon Huxley, MD;  Location: [place] [facility];  Service: Cardiovascular;  Laterality: N/A;    Prior to Admission medications   Medication Sig Start Date End Date Taking?  Authorizing Provider  clarithromycin (BIAXIN) 500 MG tablet Take 500 mg by mouth 2 (two) times daily. 07/20/17   [provider]  metroNIDAZOLE (FLAGYL) 250 MG tablet Take 250 mg by mouth 4 (four) times daily. 07/20/17   [provider]  morphine (MSIR) 15 MG tablet Take 1 tablet (15 mg total) by mouth every 6 (six) hours as needed for severe pain. Patient not taking: Reported on 07/25/2017 07/17/17   Dustin Flock, MD  ondansetron (ZOFRAN) 4 MG tablet Take 1 tablet (4 mg total) by mouth every 6 (six) hours as needed for nausea. Patient not taking: Reported on 07/25/2017 07/14/17   Loletha Grayer, MD  pantoprazole (PROTONIX) 40 MG tablet Take 1 tablet (40 mg total) by mouth 2 (two) times daily. Patient not taking: Reported on 07/25/2017 07/15/17   Loletha Grayer, MD    Allergies Codeine  Family History  Problem Relation Age of Onset  . Diabetes Mother   . Hypertension Father   . Cancer Father   . Cancer Paternal Aunt     Social History Social History   Tobacco Use  . Smoking status: Never Smoker  . Smokeless tobacco: Never Used  Substance Use Topics  . Alcohol use: Not Currently  . Drug use: Never    Review of Systems Constitutional: No fever/chills Eyes: No visual changes. ENT: No sore throat. Cardiovascular: Positive for chest pain. Respiratory: Denies shortness of breath. Gastrointestinal: No abdominal pain.  No nausea, no vomiting.  No diarrhea.  No constipation. Genitourinary: Negative  for dysuria. Musculoskeletal: Negative for neck pain.  Negative for back pain. Integumentary: Negative for rash. Neurological: Negative for headaches, focal weakness or numbness.   ____________________________________________   PHYSICAL EXAM:  VITAL SIGNS: ED Triage Vitals [08/11/17 0235]  Enc Vitals Group     BP 128/79     Pulse Rate 85     Resp 20     Temp 99.7 F (37.6 C)     Temp Source Oral     SpO2 98 %     Weight 106.6 kg (235 lb)     Height 1.778 m  (5\' 10" )     Head Circumference      Peak Flow      Pain Score 0     Pain Loc      Pain Edu?      Excl. in [place]?     Constitutional: Alert and oriented. Well appearing and in no acute distress. Eyes: Conjunctivae are normal.  Head: Atraumatic. Mouth/Throat: Mucous membranes are moist.  Oropharynx non-erythematous. Neck: No stridor.   Cardiovascular: Normal rate, regular rhythm. Good peripheral circulation. Grossly normal heart sounds. Respiratory: Normal respiratory effort.  No retractions. Lungs CTAB. Gastrointestinal: Soft and nontender. No distention.  Musculoskeletal: No lower extremity tenderness nor edema. No gross deformities of extremities. Neurologic:  Normal speech and language. No gross focal neurologic deficits are appreciated.  Skin:  Skin is warm, dry and intact. No rash noted. Psychiatric: Mood and affect are normal. Speech and behavior are normal.  ____________________________________________   LABS (all labs ordered are listed, but only abnormal results are displayed)  Labs Reviewed  CBC - Abnormal; Notable for the following components:      Result Value   RBC 3.46 (*)    Hemoglobin 8.9 (*)    HCT 26.7 (*)    MCV 77.1 (*)    MCH 25.8 (*)    RDW 18.9 (*)    All other components within normal limits  COMPREHENSIVE METABOLIC PANEL - Abnormal; Notable for the following components:   Glucose, Bld 112 (*)    Calcium 8.6 (*)    Total Protein 8.9 (*)    Albumin 3.2 (*)    AST 42 (*)    Alkaline Phosphatase 369 (*)    All other components within normal limits  FIBRIN DERIVATIVES D-DIMER ([facility] ONLY) - Abnormal; Notable for the following components:   Fibrin derivatives D-dimer [facility]) 9,811.91 (*)    All other components within normal limits  TROPONIN I  TROPONIN I   ____________________________________________  EKG  ED ECG REPORT I, [place] Zeven Kocak, the attending physician, personally viewed and interpreted this ECG.   Date: 08/13/2017  EKG Time: 2:39  AM  Rate: 87  Rhythm: Normal sinus rhythm  Axis: Normal  Intervals: Normal  ST&T Change: None  ____________________________________________  RADIOLOGY I, [place] N Danelia Snodgrass, personally viewed and evaluated these images (plain radiographs) as part of my medical decision making, as well as reviewing the written report by the radiologist.  ED MD interpretation: T. Angie of the chest revealed no evidence of pulmonary emboli.  Official radiology report(s):  _____CLINICAL DATA:  Shooting pain to the left axilla since last night. Pain increases with movement. Port-A-Cath placement 3 weeks ago. Colon and liver cancer but is not start treatment yet.  EXAM: CT ANGIOGRAPHY CHEST WITH CONTRAST  TECHNIQUE: Multidetector CT imaging of the chest was performed using the standard protocol during bolus administration of intravenous contrast. Multiplanar CT image reconstructions and MIPs were obtained to  evaluate the vascular anatomy.  CONTRAST:  30mL ISOVUE-370 IOPAMIDOL (ISOVUE-370) INJECTION 76%  COMPARISON:  07/16/2017  FINDINGS: Cardiovascular: There is good opacification of the central and segmental pulmonary arteries. No focal filling defects. No evidence of significant pulmonary embolus. Normal caliber thoracic aorta. No aortic dissection. Normal heart size. No pericardial effusion.  Mediastinum/Nodes: Esophagus is decompressed. 16 mm lymph node in the right cardiophrenic angle. Mildly prominent node in the right internal mammary region measuring 8 mm. 7 mm pretracheal lymph node period.  Lungs/Pleura: 3 mm nodule in the right upper lung. No change since previous study. 5 mm nodule in the right upper lung centrally is unchanged. Additional smaller nodules are present. 6 mm nodule in the right lung base is also unchanged. No airspace disease or consolidation. No pleural effusions. No pneumothorax.  Upper Abdomen: Diffuse hepatic enlargement. Heterogeneous appearance of  liver parenchyma with central calcified lesion consistent with known metastatic disease. This likely represents diffuse metastatic disease although visualization is limited in the early arterial phase.  Musculoskeletal: No destructive bone lesions. Increased density in the breast region consistent with gynecomastia.  Review of the MIP images confirms the above findings.  IMPRESSION: No evidence of significant pulmonary embolus. No evidence of active pulmonary disease. Diffuse hepatic enlargement with multiple calcified lesions consistent with known metastatic disease. Multiple bilateral pulmonary nodules, largest measuring 6 mm. Indeterminate mildly enlarged lymph nodes period.   Electronically Signed   By: Lucienne Capers M.D.   On: 08/11/2017 06:02_______________________________________  Procedures   ____________________________________________   INITIAL IMPRESSION / ASSESSMENT AND PLAN / ED COURSE  As part of my medical decision making, I reviewed the following data within the electronic MEDICAL RECORD NUMBER   52 year old male presenting with above-stated history and physical exam secondary to the left lateral chest wall pain radiating to axilla and left arm.  Consider the possibility of cardiac etiology and as such EKG was performed which revealed no evidence of ischemia or infarction likewise troponin x2 was obtained which were both negative.  Given recent Port-A-Cath placement and patient's recently diagnosed cancer considered possibly pulmonary emboli and as such CT angiogram was performed which revealed no evidence of PE.  No clear etiology for the patient's chest pain identified the patient remained chest pain-free throughout stay in the emergency department.  Recommended outpatient follow-up for further evaluation and management if pain were to recur. ____________________________________________  FINAL CLINICAL IMPRESSION(S) / ED DIAGNOSES  Final diagnoses:  Chest  pain, unspecified type     MEDICATIONS GIVEN DURING THIS VISIT:  Medications  iopamidol (ISOVUE-370) 76 % injection 75 mL (75 mLs Intravenous Contrast Given 08/11/17 0530)     ED Discharge Orders    None       Note:  This document was prepared using Dragon voice recognition software and may include unintentional dictation errors.    Gregor Hams, MD 08/13/17 2238

## 2017-10-21 ENCOUNTER — Emergency Department
Admission: EM | Admit: 2017-10-21 | Discharge: 2017-10-21 | Disposition: A | Discharge status: Home / Self Care | Payer: BLUE CROSS/BLUE SHIELD | Attending: Emergency Medicine | Admitting: Emergency Medicine

## 2017-10-21 ENCOUNTER — Other Ambulatory Visit: Payer: Self-pay

## 2017-10-21 ENCOUNTER — Encounter: Payer: Self-pay | Admitting: Emergency Medicine

## 2017-10-21 ENCOUNTER — Emergency Department: Payer: BLUE CROSS/BLUE SHIELD

## 2017-10-21 DIAGNOSIS — E785 Hyperlipidemia, unspecified: Secondary | ICD-10-CM | POA: Insufficient documentation

## 2017-10-21 DIAGNOSIS — R197 Diarrhea, unspecified: Secondary | ICD-10-CM | POA: Insufficient documentation

## 2017-10-21 DIAGNOSIS — R109 Unspecified abdominal pain: Secondary | ICD-10-CM | POA: Insufficient documentation

## 2017-10-21 DIAGNOSIS — Z79899 Other long term (current) drug therapy: Secondary | ICD-10-CM | POA: Diagnosis not present

## 2017-10-21 LAB — COMPREHENSIVE METABOLIC PANEL
ALT: 18 U/L (ref 0–44)
AST: 26 U/L (ref 15–41)
Albumin: 3.2 g/dL — ABNORMAL LOW (ref 3.5–5.0)
Alkaline Phosphatase: 98 U/L (ref 38–126)
Anion gap: 8 (ref 5–15)
BUN: 11 mg/dL (ref 6–20)
CHLORIDE: 104 mmol/L (ref 98–111)
CO2: 22 mmol/L (ref 22–32)
Calcium: 8.6 mg/dL — ABNORMAL LOW (ref 8.9–10.3)
Creatinine, Ser: 0.71 mg/dL (ref 0.61–1.24)
GFR calc Af Amer: >60 mL/min (ref >60)
GFR calc non Af Amer: >60 mL/min (ref >60)
GLUCOSE: 115 mg/dL — AB (ref 70–99)
POTASSIUM: 3.1 mmol/L — AB (ref 3.5–5.1)
Sodium: 134 mmol/L — ABNORMAL LOW (ref 135–145)
Total Bilirubin: 1.5 mg/dL — ABNORMAL HIGH (ref 0.3–1.2)
Total Protein: 6.6 g/dL (ref 6.5–8.1)

## 2017-10-21 LAB — URINALYSIS, COMPLETE (UACMP) WITH MICROSCOPIC
BACTERIA UA: NONE SEEN
BILIRUBIN URINE: NEGATIVE
Glucose, UA: NEGATIVE mg/dL
Hgb urine dipstick: NEGATIVE
Ketones, ur: NEGATIVE mg/dL
LEUKOCYTES UA: NEGATIVE
Nitrite: NEGATIVE
Protein, ur: 30 mg/dL — AB
SPECIFIC GRAVITY, URINE: 1.032 — AB (ref 1.005–1.030)
SQUAMOUS EPITHELIAL / LPF: NONE SEEN (ref 0–5)
pH: 5 (ref 5.0–8.0)

## 2017-10-21 LAB — CBC
HEMATOCRIT: 45.1 % (ref 40.0–52.0)
Hemoglobin: 14.5 g/dL (ref 13.0–18.0)
MCH: 27.3 pg (ref 26.0–34.0)
MCHC: 32.2 g/dL (ref 32.0–36.0)
MCV: 84.7 fL (ref 80.0–100.0)
Platelets: 165 10*3/uL (ref 150–440)
RBC: 5.33 MIL/uL (ref 4.40–5.90)
RDW: 30.3 % — AB (ref 11.5–14.5)
WBC: 5 10*3/uL (ref 3.8–10.6)

## 2017-10-21 LAB — LIPASE, BLOOD: Lipase: 33 U/L (ref 11–51)

## 2017-10-21 MED ORDER — HEPARIN SOD (PORK) LOCK FLUSH 100 UNIT/ML IV SOLN
500.0000 [IU] | Freq: Once | INTRAVENOUS | Status: AC
  Administered 2017-10-21: 500 [IU] via INTRAVENOUS
  Filled 2017-10-21: qty 5

## 2017-10-21 MED ORDER — CIPROFLOXACIN HCL 500 MG PO TABS
500.0000 mg | ORAL_TABLET | Freq: Once | ORAL | Status: AC
  Administered 2017-10-21: 500 mg via ORAL
  Filled 2017-10-21: qty 1

## 2017-10-21 MED ORDER — CIPROFLOXACIN HCL 500 MG PO TABS: 500.0000 mg | ORAL_TABLET | Freq: Two times a day (BID) | ORAL | 0 refills | Status: AC

## 2017-10-21 MED ORDER — IOPAMIDOL (ISOVUE-300) INJECTION 61%
30.0000 mL | Freq: Once | INTRAVENOUS | Status: AC | PRN
  Administered 2017-10-21: 30 mL via ORAL

## 2017-10-21 MED ORDER — IOPAMIDOL (ISOVUE-300) INJECTION 61%
100.0000 mL | Freq: Once | INTRAVENOUS | Status: AC | PRN
  Administered 2017-10-21: 100 mL via INTRAVENOUS

## 2017-10-21 NOTE — ED Provider Notes (Signed)
CT scan is overall reassuring.,  Patient is feeling well, has provided stool sample, appropriate for discharge at this time, patient agrees with plan.  Return precautions discussed   Lavonia Drafts, MD 10/21/17 2259

## 2017-10-21 NOTE — ED Triage Notes (Signed)
Pt reports that this is his week off of his chemo pill, takes it for colon ca, pt states that he takes it for 2 weeks and then is off a week, pt reports 3 days of diarrhea this week and abd discomfort, upset all day, states that this is different, has been on the med for 2 mos

## 2017-10-21 NOTE — ED Notes (Signed)
Pt unable to provide stool sample. Educated to notify nurse when he can.

## 2017-10-21 NOTE — ED Notes (Signed)
Right chest single power port accessed with 20ga x .75 needle. Biopatch, sterile tegaderm applied. + blood return.

## 2017-10-21 NOTE — ED Provider Notes (Signed)
[facility] Emergency Department Provider Note   ____________________________________________   First MD Initiated Contact with Patient 10/21/17 1853     (approximate)  I have reviewed the triage vital signs and the nursing notes.   HISTORY  Chief Complaint Abdominal Pain and Diarrhea    HPI Webb Weed is a 52 y.o. male patient reports 3 days of crampy abdominal pain diarrhea.  The pain is moderate.  And he is having some now.  He has one episode of very watery diarrhea a day.  Stomach feels upset all day long though.  He is never had this before with the cancer therapy that he is taking.  He has a low-grade fever 99 and is feeling kind of weak.   Past Medical History:  Diagnosis Date  . Cancer [facility])    Liver & colon Cancer.  therapy treatment not started as of 08-11-17.  Marland Kitchen Hyperlipemia     Patient Active Problem List   Diagnosis Date Noted  . Helicobacter pylori gastritis 07/20/2017  . Pulmonary nodules 07/20/2017  . Anemia   . Colonic neoplasm   . Polyp of sigmoid colon   . Abnormal computed tomography of large intestine   . Liver mass   . GI bleed 07/13/2017    Past Surgical History:  Procedure Laterality Date  . COLONOSCOPY WITH PROPOFOL N/A 07/16/2017   Procedure: COLONOSCOPY WITH PROPOFOL;  Surgeon: Virgel Manifold, MD;  Location: [facility] ENDOSCOPY;  Service: Endoscopy;  Laterality: N/A;  . ESOPHAGOGASTRODUODENOSCOPY (EGD) WITH PROPOFOL N/A 07/14/2017   Procedure: ESOPHAGOGASTRODUODENOSCOPY (EGD) WITH PROPOFOL;  Surgeon: Toledo, Benay Pike, MD;  Location: [facility] ENDOSCOPY;  Service: Gastroenterology;  Laterality: N/A;  . none    . PORTA CATH INSERTION N/A 07/25/2017   Procedure: PORTA CATH INSERTION;  Surgeon: Algernon Huxley, MD;  Location: [place] CV LAB;  Service: Cardiovascular;  Laterality: N/A;    Prior to Admission medications   Medication Sig Start Date End Date Taking? Authorizing Provider  clarithromycin (BIAXIN) 500 MG  tablet Take 500 mg by mouth 2 (two) times daily. 07/20/17   [provider]  metroNIDAZOLE (FLAGYL) 250 MG tablet Take 250 mg by mouth 4 (four) times daily. 07/20/17   [provider]  morphine (MSIR) 15 MG tablet Take 1 tablet (15 mg total) by mouth every 6 (six) hours as needed for severe pain. Patient not taking: Reported on 07/25/2017 07/17/17   Dustin Flock, MD  ondansetron (ZOFRAN) 4 MG tablet Take 1 tablet (4 mg total) by mouth every 6 (six) hours as needed for nausea. Patient not taking: Reported on 07/25/2017 07/14/17   Loletha Grayer, MD  pantoprazole (PROTONIX) 40 MG tablet Take 1 tablet (40 mg total) by mouth 2 (two) times daily. Patient not taking: Reported on 07/25/2017 07/15/17   Loletha Grayer, MD    Allergies Codeine  Family History  Problem Relation Age of Onset  . Diabetes Mother   . Hypertension Father   . Cancer Father   . Cancer Paternal Aunt     Social History Social History   Tobacco Use  . Smoking status: Never Smoker  . Smokeless tobacco: Never Used  Substance Use Topics  . Alcohol use: Not Currently  . Drug use: Never    Review of Systems  Constitutional: No fever/chills temperature is 99 Eyes: No visual changes. ENT: No sore throat. Cardiovascular: Denies chest pain. Respiratory: Denies shortness of breath. Gastrointestinal: See HPI Genitourinary: Negative for dysuria. Musculoskeletal: Negative for back pain. Skin: Negative  for rash. Neurological: Negative for headaches, focal weakness   ____________________________________________   PHYSICAL EXAM:  VITAL SIGNS: ED Triage Vitals  Enc Vitals Group     BP 10/21/17 1755 109/72     Pulse Rate 10/21/17 1755 88     Resp 10/21/17 1755 18     Temp 10/21/17 1755 99.1 F (37.3 C)     Temp Source 10/21/17 1755 Oral     SpO2 10/21/17 1755 99 %     Weight 10/21/17 1756 220 lb (99.8 kg)     Height 10/21/17 1756 5\' 10"  (1.778 m)     Head Circumference --      Peak Flow --       Pain Score 10/21/17 1756 5     Pain Loc --      Pain Edu? --      Excl. in [place]? --     Constitutional: Alert and oriented.  Looks tired Eyes: Conjunctivae are normal.  Head: Atraumatic. Nose: No congestion/rhinnorhea. Mouth/Throat: Mucous membranes are moist.  Oropharynx non-erythematous. Neck: No stridor.  Cardiovascular: Normal rate, regular rhythm. Grossly normal heart sounds.  Good peripheral circulation. Respiratory: Normal respiratory effort.  No retractions. Lungs CTAB. Gastrointestinal: Soft and nontender. No distention. No abdominal bruits. No CVA tenderness. }Musculoskeletal: No lower extremity tenderness nor edema.   Neurologic:  Normal speech and language. No gross focal neurologic deficits are appreciated. Skin:  Skin is warm, dry and intact. No rash noted. Psychiatric: Mood and affect are normal. Speech and behavior are normal.  ____________________________________________   LABS (all labs ordered are listed, but only abnormal results are displayed)  Labs Reviewed  COMPREHENSIVE METABOLIC PANEL - Abnormal; Notable for the following components:      Result Value   Sodium 134 (*)    Potassium 3.1 (*)    Glucose, Bld 115 (*)    Calcium 8.6 (*)    Albumin 3.2 (*)    Total Bilirubin 1.5 (*)    All other components within normal limits  CBC - Abnormal; Notable for the following components:   RDW 30.3 (*)    All other components within normal limits  URINALYSIS, COMPLETE (UACMP) WITH MICROSCOPIC - Abnormal; Notable for the following components:   Color, Urine AMBER (*)    APPearance CLEAR (*)    Specific Gravity, Urine 1.032 (*)    Protein, ur 30 (*)    All other components within normal limits  GASTROINTESTINAL PANEL BY PCR, STOOL (REPLACES STOOL CULTURE)  C DIFFICILE QUICK SCREEN W PCR REFLEX  LIPASE, BLOOD   ____________________________________________  EKG   ____________________________________________  RADIOLOGY  ED MD interpretation:     Official radiology report(s): No results found.  ____________________________________________   PROCEDURES  Procedure(s) performed:   Procedures  Critical Care performed:   ____________________________________________   INITIAL IMPRESSION / ASSESSMENT AND PLAN / ED COURSE Patient does have a fever but no white count.  Is crampy abdominal pain.  He has not had any diarrhea here in the emergency room.  He and his family are very worried.  He does have already a history of colon cancer.  I  ordered an abdominal CT when the patient got here.  Once I got the lab work back I want to talk to the patient to offer skipping the CT scan but they are still very worried and do not want to skip the CT scan.  He is not having any abdominal pain.  His family says he is not having any diarrhea  because he has not eaten anything.  He is now drinking the contrast.  I will give him a liter of saline as well since his sodium is slightly low and his potassium was also slightly low.  I will end up signing the patient out to Dr. Corky Downs as it is now 72.        ____________________________________________   FINAL CLINICAL IMPRESSION(S) / ED DIAGNOSES  Final diagnoses:  Abdominal pain, unspecified abdominal location  Diarrhea, unspecified type     ED Discharge Orders    None       Note:  This document was prepared using Dragon voice recognition software and may include unintentional dictation errors.    Nena Polio, MD 10/21/17 2131

## 2017-10-21 NOTE — ED Notes (Signed)
Port de accessed post heparin flush. Sterile dressing applied over site.

## 2017-10-21 NOTE — ED Notes (Signed)
First Nurse Note: Pt c/o diarrhea. Pt is cancer pt, currently taking chemo pills. Pt last took chemo pill on Tuesday.

## 2017-10-22 LAB — GASTROINTESTINAL PANEL BY PCR, STOOL (REPLACES STOOL CULTURE)
ASTROVIRUS: NOT DETECTED
Adenovirus F40/41: NOT DETECTED
Campylobacter species: NOT DETECTED
Cryptosporidium: NOT DETECTED
Cyclospora cayetanensis: NOT DETECTED
ENTAMOEBA HISTOLYTICA: NOT DETECTED
ENTEROPATHOGENIC E COLI (EPEC): NOT DETECTED
ENTEROTOXIGENIC E COLI (ETEC): NOT DETECTED
Enteroaggregative E coli (EAEC): NOT DETECTED
GIARDIA LAMBLIA: NOT DETECTED
Norovirus GI/GII: NOT DETECTED
Plesimonas shigelloides: NOT DETECTED
Rotavirus A: NOT DETECTED
Salmonella species: NOT DETECTED
Sapovirus (I, II, IV, and V): NOT DETECTED
Shiga like toxin producing E coli (STEC): NOT DETECTED
Shigella/Enteroinvasive E coli (EIEC): NOT DETECTED
VIBRIO CHOLERAE: NOT DETECTED
VIBRIO SPECIES: NOT DETECTED
Yersinia enterocolitica: NOT DETECTED

## 2017-10-22 LAB — C DIFFICILE QUICK SCREEN W PCR REFLEX
C DIFFICLE (CDIFF) ANTIGEN: NEGATIVE
C Diff interpretation: NOT DETECTED
C Diff toxin: NEGATIVE

## 2019-06-10 ENCOUNTER — Emergency Department
Admission: EM | Admit: 2019-06-10 | Discharge: 2019-06-10 | Disposition: A | Discharge status: Home / Self Care | Payer: BLUE CROSS/BLUE SHIELD | Attending: Emergency Medicine | Admitting: Emergency Medicine

## 2019-06-10 DIAGNOSIS — C189 Malignant neoplasm of colon, unspecified: Secondary | ICD-10-CM | POA: Insufficient documentation

## 2019-06-10 DIAGNOSIS — R531 Weakness: Secondary | ICD-10-CM | POA: Insufficient documentation

## 2019-06-10 DIAGNOSIS — R140 Abdominal distension (gaseous): Secondary | ICD-10-CM | POA: Insufficient documentation

## 2019-06-10 DIAGNOSIS — C787 Secondary malignant neoplasm of liver and intrahepatic bile duct: Secondary | ICD-10-CM | POA: Insufficient documentation

## 2019-06-10 HISTORY — DX: Hepatic failure, unspecified without coma: K72.90

## 2019-06-10 NOTE — ED Notes (Signed)
Pt provided with pillow and family member with blanket

## 2019-06-10 NOTE — ED Notes (Signed)
STAT Desk called this nurse, states family is here for pt. Family to come to room . MD states he will talk to pt family and place orders following plan of care.

## 2019-06-10 NOTE — ED Provider Notes (Signed)
[facility] Emergency Department Provider Note   ____________________________________________   First MD Initiated Contact with Patient 06/10/19 2048     (approximate)  I have reviewed the triage vital signs and the nursing notes.   HISTORY  Chief Complaint Decreased Intake    HPI Omar Young is a 54 y.o. 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  History is limited due to patient's confusion and majority of history is obtained from his wife.  She states that he has become increasingly weak since the end of last week, is now beginning to have difficulty walking and has seemed more confused.  His abdomen has also seem more distended than in the past, but he has not complained of any abdominal pain or had any vomiting.  He has not had any fevers, cough, chest pain, or shortness of breath, but wife is worried his distended abdomen is starting to put pressure on his chest.  He currently follows with oncology at [facility], but was recently transitioned off all medications other than morphine.  Wife states that they have been contemplating hospice care, but that she has not yet gotten back to his PCP with a decision.  She is currently concerned that he is weaker than usual and could be getting dehydrated.        Past Medical History:  Diagnosis Date  . Cancer [facility])    Liver & colon Cancer.  therapy treatment not started as of 08-11-17.  Marland Kitchen Hyperlipemia   . Liver failure [facility])     Patient Active Problem List   Diagnosis Date Noted  . Helicobacter pylori gastritis 07/20/2017  . Pulmonary nodules 07/20/2017  . Anemia   . Colonic neoplasm   . Polyp of sigmoid colon   . Abnormal computed tomography of large intestine   . Liver mass   . GI bleed 07/13/2017    Past Surgical History:  Procedure Laterality Date  . COLONOSCOPY WITH PROPOFOL N/A 07/16/2017   Procedure: COLONOSCOPY WITH PROPOFOL;  Surgeon: Virgel Manifold, MD;  Location: [facility] ENDOSCOPY;  Service: Endoscopy;  Laterality: N/A;  . ESOPHAGOGASTRODUODENOSCOPY (EGD) WITH PROPOFOL N/A 07/14/2017   Procedure: ESOPHAGOGASTRODUODENOSCOPY (EGD) WITH PROPOFOL;  Surgeon: Toledo, Benay Pike, MD;  Location: [facility] ENDOSCOPY;  Service: Gastroenterology;  Laterality: N/A;  . none    . PORTA CATH INSERTION N/A 07/25/2017   Procedure: PORTA CATH INSERTION;  Surgeon: Algernon Huxley, MD;  Location: [place] CV LAB;  Service: Cardiovascular;  Laterality: N/A;    Prior to Admission medications   Medication Sig Start Date End Date Taking? Authorizing Provider  ciprofloxacin (CIPRO) 500 MG tablet Take 1 tablet (500 mg total) by mouth 2 (two) times daily. 10/21/17   Lavonia Drafts, MD  clarithromycin (BIAXIN) 500 MG tablet Take 500 mg by mouth 2 (two) times daily. 07/20/17   [provider]  metroNIDAZOLE (FLAGYL) 250 MG tablet Take 250 mg by mouth 4 (four) times daily. 07/20/17   [provider]  morphine (MSIR) 15 MG tablet Take 1 tablet (15 mg total) by mouth every 6 (six) hours as needed for severe pain. Patient not taking: Reported on 07/25/2017 07/17/17   Dustin Flock, MD  ondansetron (ZOFRAN) 4 MG tablet Take 1 tablet (4 mg total) by mouth every 6 (six) hours as needed for nausea. Patient not taking: Reported on 07/25/2017 07/14/17   Loletha Grayer, MD  pantoprazole (PROTONIX) 40 MG  tablet Take 1 tablet (40 mg total) by mouth 2 (two) times daily. Patient not taking: Reported on 07/25/2017 07/15/17   Loletha Grayer, MD    Allergies Codeine  Family History  Problem Relation Age of Onset  . Diabetes Mother   . Hypertension Father   . Cancer Father   . Cancer Paternal Aunt     Social History Social History   Tobacco Use  . Smoking status: Never Smoker  . Smokeless tobacco: Never Used  Substance Use Topics  . Alcohol use: Not Currently  . Drug use: Never    Review of  Systems Unable to obtain secondary to altered mental status.  ____________________________________________   PHYSICAL EXAM:  VITAL SIGNS: ED Triage Vitals  Enc Vitals Group     BP      Pulse      Resp      Temp      Temp src      SpO2      Weight      Height      Head Circumference      Peak Flow      Pain Score      Pain Loc      Pain Edu?      Excl. in [place]?     Constitutional: Somnolent but easily arousable. Eyes: Scleral icterus. Head: Atraumatic. Nose: No congestion/rhinnorhea. Mouth/Throat: Mucous membranes are moist. Neck: Normal ROM Cardiovascular: Normal rate, regular rhythm. Grossly normal heart sounds. Respiratory: Normal respiratory effort.  No retractions. Lungs CTAB. Gastrointestinal: Soft and nontender.  Distended with fluid wave. Genitourinary: deferred Musculoskeletal: No lower extremity tenderness, 1+ pitting edema bilaterally. Neurologic:  Normal speech and language. No gross focal neurologic deficits are appreciated. Skin:  Skin is warm, dry and intact. No rash noted.  Jaundiced. Psychiatric: Mood and affect are normal. Speech and behavior are normal.  ____________________________________________   LABS (all labs ordered are listed, but only abnormal results are displayed)  Labs Reviewed - No data to display ____________________________________________  EKG  ED ECG REPORT I, Blake Divine, the attending physician, personally viewed and interpreted this ECG.   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 intake over the past  couple of days.  He is quite somnolent at this point and has difficulty answering questions.  He is known to have advanced cancer that has not responded to prior treatments, recently transitioned to morphine only and family has been in discussion for possible hospice care.  Wife is now most concerned that he could be dehydrated, but agrees that her goals for his care is to make him as comfortable as possible.  I discussed multiple options with her including admission for comfort care and possible paracentesis, however I did explain that prognosis is very poor and any intervention is unlikely to change outcome.  She was also offered to have him observed overnight in the ED so that he could be seen by palliative care in the morning.  She prefers to hold off on any intervention for now, including blood work, and would like to have the patient discharged so that  he can follow-up at [facility] tomorrow.      ____________________________________________   FINAL CLINICAL IMPRESSION(S) / ED DIAGNOSES  Final diagnoses:  Generalized weakness  Metastatic colon cancer to liver [facility])     ED Discharge Orders    None       Note:  This document was prepared using Dragon voice recognition software and may include unintentional dictation errors.   Blake Divine, MD 06/10/19 [phone]

## 2019-06-10 NOTE — ED Triage Notes (Signed)
Pt comes from home via [facility] due to family reporting decreased intake for the last several days and fear of dehydraton.  On arrival, EMS states pt has hx of colon caner and has since stopped treatment and only takes morphine at this time. Pt arrives with ascites and leg swelling and jaundiced. EMS states family is on the way to ED.  Pt denies pain at this time.

## 2019-06-19 DEATH — deceased

## 2020-06-07 IMAGING — CT CT ANGIO CHEST
2 of 7 series · 18 of 46 positions shown · IV contrast (APPLIED)
Comparison: 07/16/2017

CLINICAL DATA: Shooting pain to the left axilla since last night.
Pain increases with movement. Port-A-Cath placement 3 weeks ago.
Colon and liver cancer but is not start treatment yet.

EXAM:
CT ANGIOGRAPHY CHEST WITH CONTRAST
TECHNIQUE: Multidetector CT imaging of the chest was performed using the
standard protocol during bolus administration of intravenous
contrast. Multiplanar CT image reconstructions and MIPs were
obtained to evaluate the vascular anatomy.
CONTRAST:  75mL [id number] IOPAMIDOL ([id number]) INJECTION 76%

[Series 6: thins · axial · 0.70mm/px · z∈[-537,-259]mm · 15 of 312 slices shown]
[im 17/312  lung]
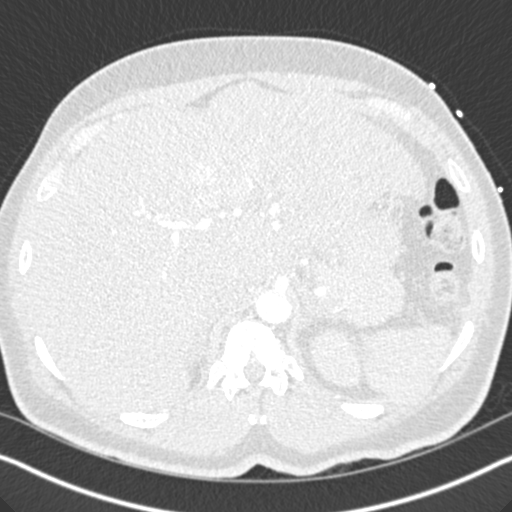
[im 33/312  soft-tissue]
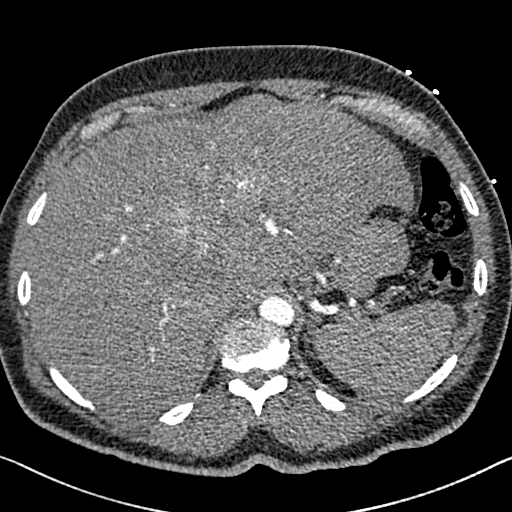
[im 66/312  lung]
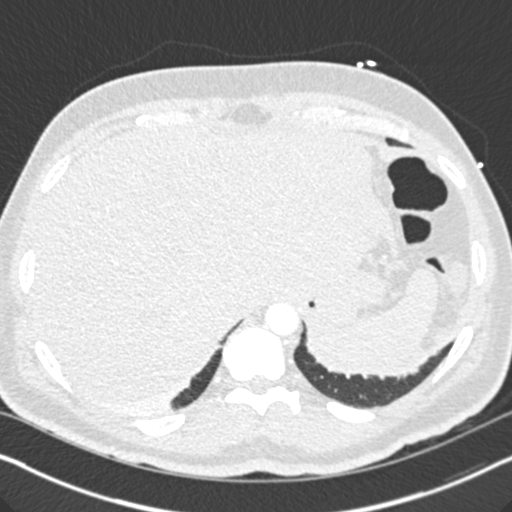
[im 82/312  soft-tissue]
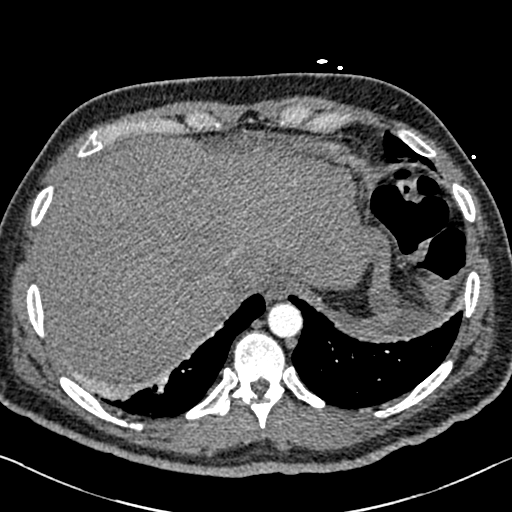
[im 99/312  lung]
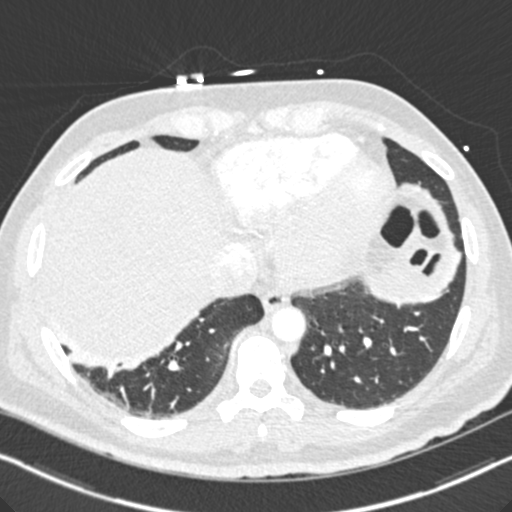
[im 115/312  soft-tissue]
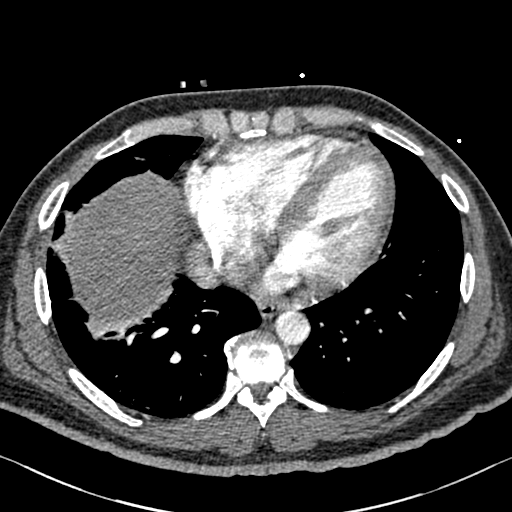
[im 131/312  lung]
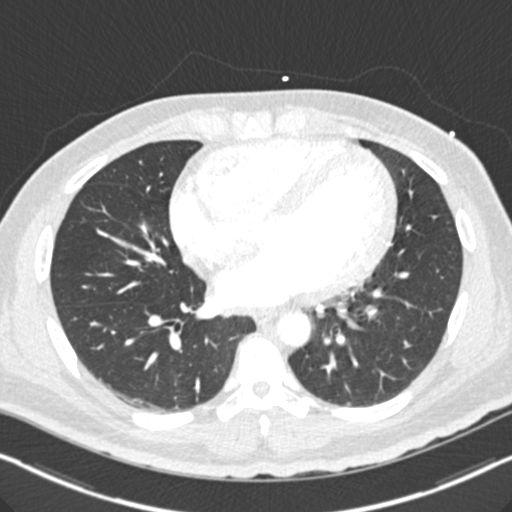
[im 164/312  soft-tissue]
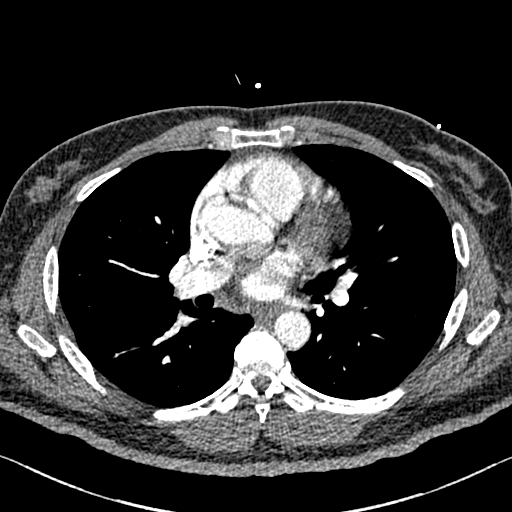
[im 181/312  lung]
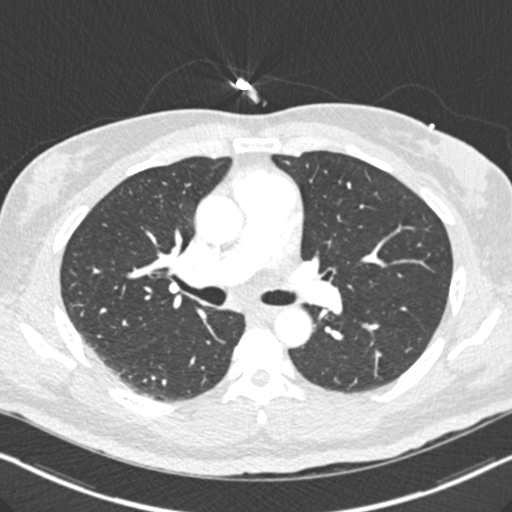
[im 197/312  soft-tissue]
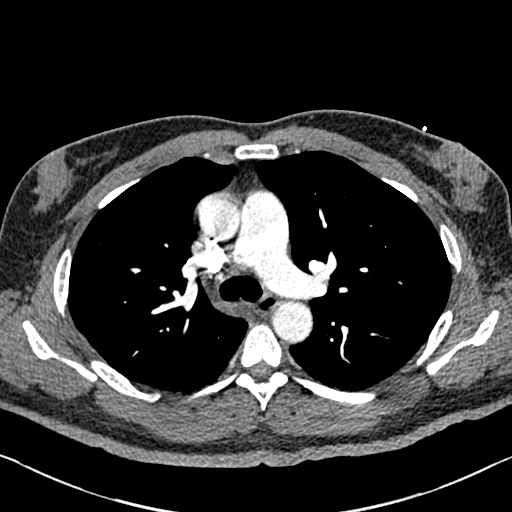
[im 213/312  lung]
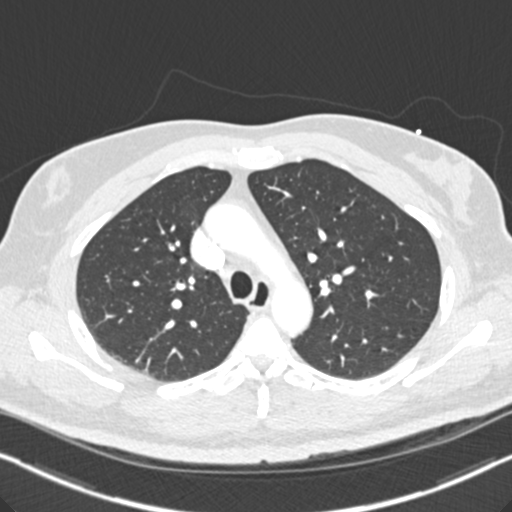
[im 230/312  soft-tissue]
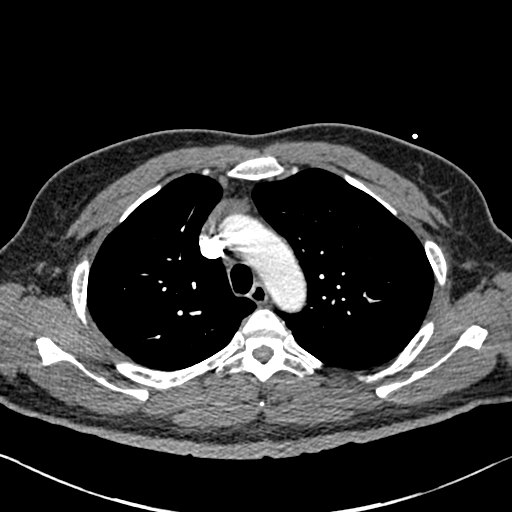
[im 262/312  lung]
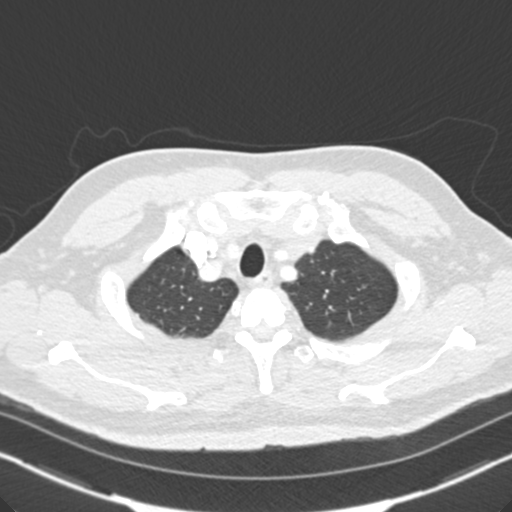
[im 279/312  soft-tissue]
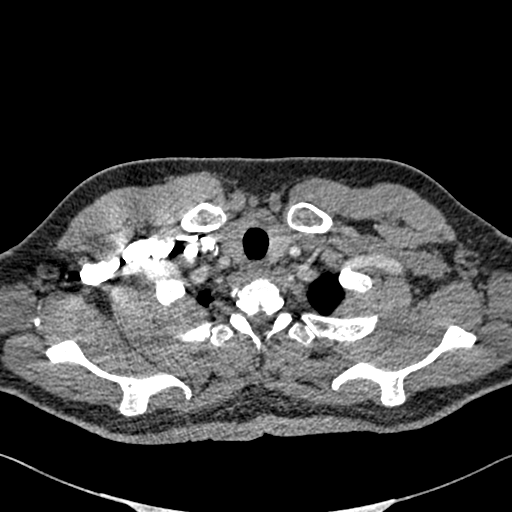
[im 295/312  lung]
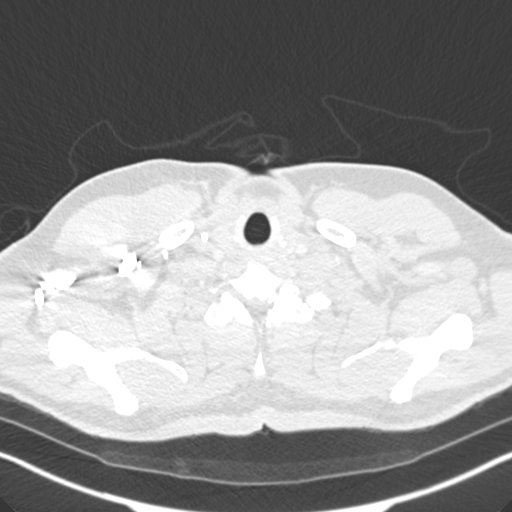

[Series 8: coronal mpr · coronal · 0.62mm/px · 3 of 93 slices shown]
[im 24/93  soft-tissue]
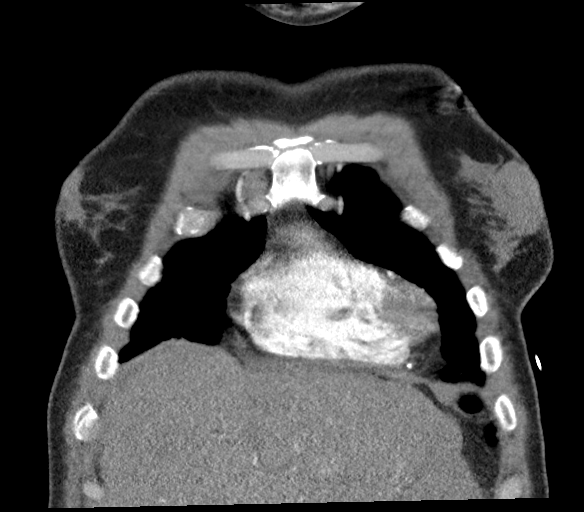
[im 47/93  soft-tissue]
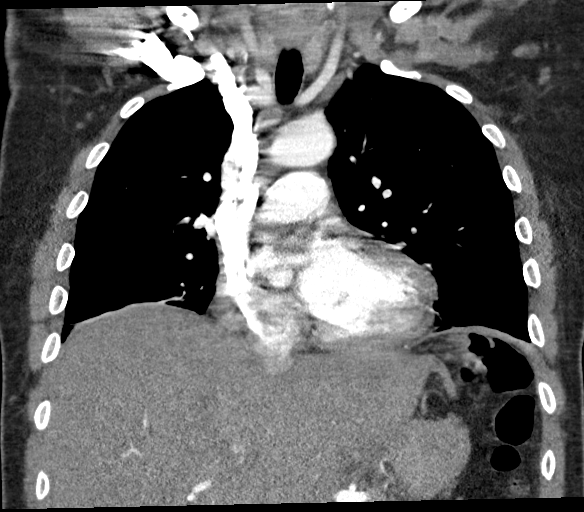
[im 70/93  soft-tissue]
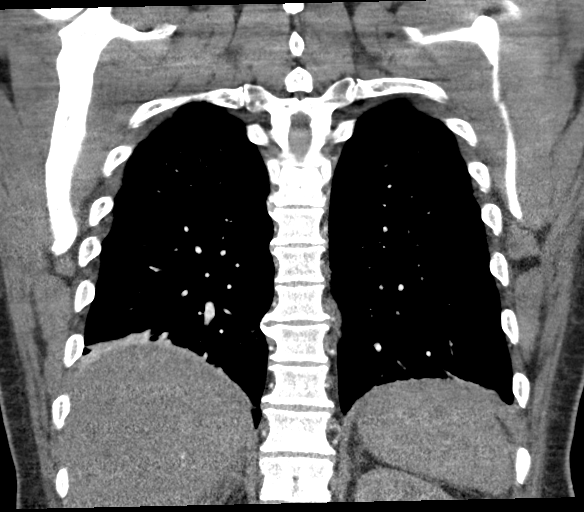

[18 of 46 positions shown; findings below may reference images not displayed]

FINDINGS: Cardiovascular: There is good opacification of the central and
segmental pulmonary arteries. No focal filling defects. No evidence
of significant pulmonary embolus. Normal caliber thoracic aorta. No
aortic dissection. Normal heart size. No pericardial effusion.

Mediastinum/Nodes: Esophagus is decompressed. 16 mm lymph node in
the right cardiophrenic angle. Mildly prominent node in the right
internal mammary region measuring 8 mm. 7 mm pretracheal lymph node
period.

Lungs/Pleura: 3 mm nodule in the right upper lung. No change since
previous study. 5 mm nodule in the right upper lung centrally is
unchanged. Additional smaller nodules are present. 6 mm nodule in
the right lung base is also unchanged. No airspace disease or
consolidation. No pleural effusions. No pneumothorax.

Upper Abdomen: Diffuse hepatic enlargement. Heterogeneous appearance
of liver parenchyma with central calcified lesion consistent with
known metastatic disease. This likely represents diffuse metastatic
disease although visualization is limited in the early arterial
phase.

Musculoskeletal: No destructive bone lesions. Increased density in
the breast region consistent with gynecomastia.

Review of the MIP images confirms the above findings.
IMPRESSION: No evidence of significant pulmonary embolus. No evidence of active
pulmonary disease. Diffuse hepatic enlargement with multiple
calcified lesions consistent with known metastatic disease. Multiple
bilateral pulmonary nodules, largest measuring 6 mm. Indeterminate
mildly enlarged lymph nodes period.

## 2020-08-17 IMAGING — CT CT ABD-PELV W/ CM
2 of 5 series · 15 of 46 positions shown, 17 images · IV contrast (APPLIED)
Comparison: Abdominal CT 07/14/2017

CLINICAL DATA: Acute abdominal pain. Diarrhea this week. Patient
with history of colon cancer, active chemotherapy.

EXAM:
CT ABDOMEN AND PELVIS WITH CONTRAST
TECHNIQUE: Multidetector CT imaging of the abdomen and pelvis was performed
using the standard protocol following bolus administration of
intravenous contrast.
CONTRAST:  100mL [id number] IOPAMIDOL ([id number]) INJECTION 61%

[Series 2: routine abd/pel with · axial · 0.90mm/px · z∈[-352,+108]mm · 12 of 104 slices shown, 14 images]
[im 6/104  soft-tissue]
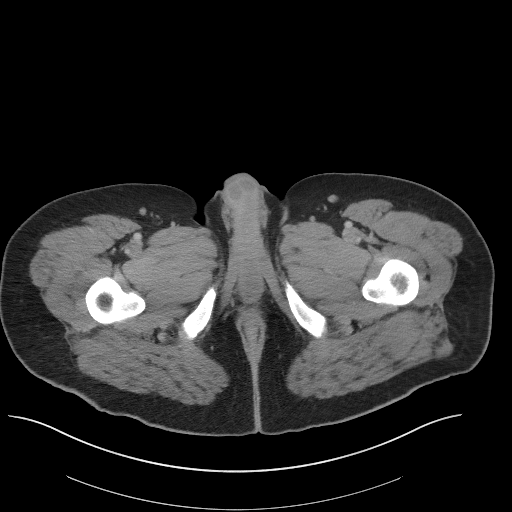
[im 6/104  bone]
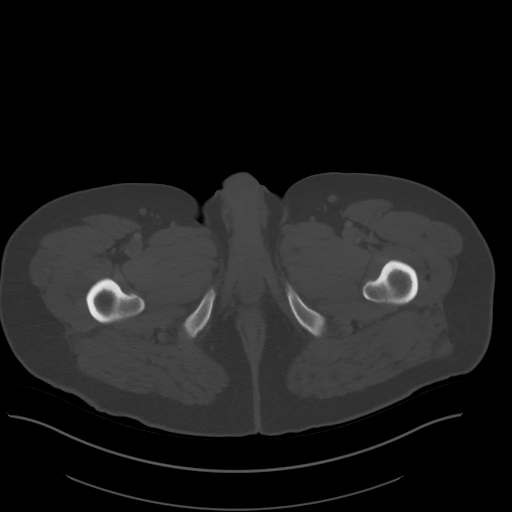
[im 18/104  soft-tissue]
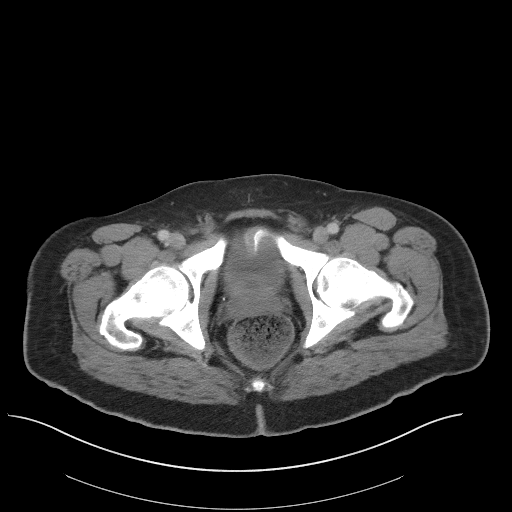
[im 23/104  soft-tissue]
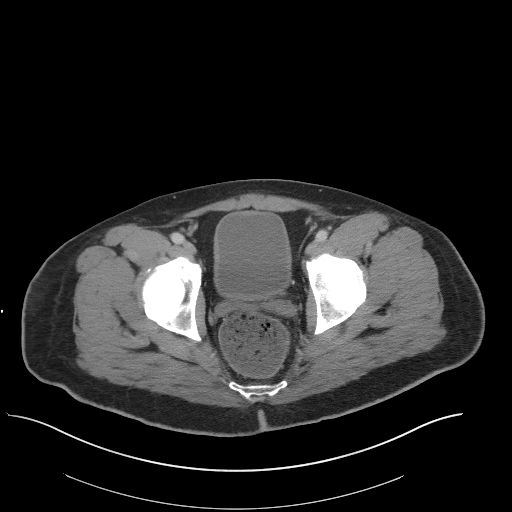
[im 29/104  soft-tissue]
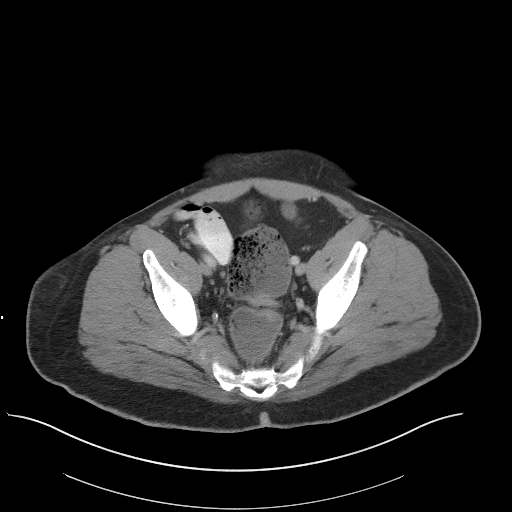
[im 41/104  soft-tissue]
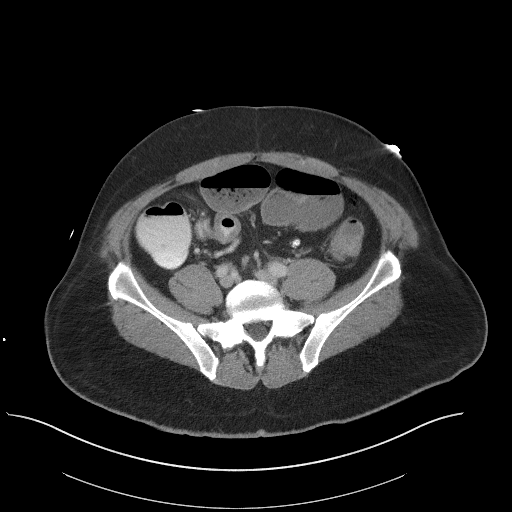
[im 46/104  soft-tissue]
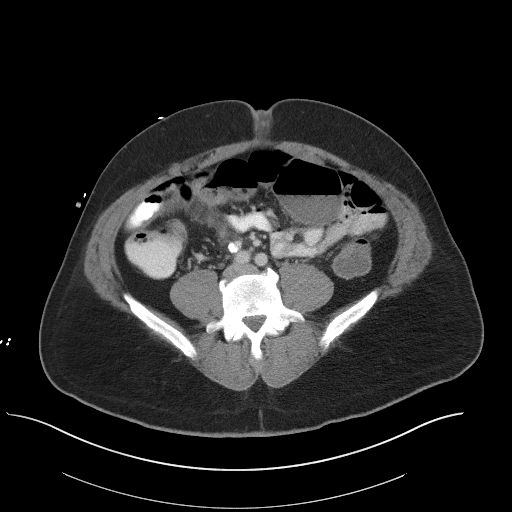
[im 58/104  soft-tissue]
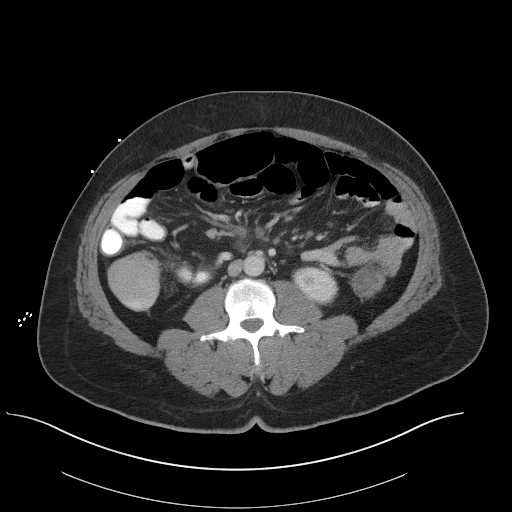
[im 63/104  soft-tissue]
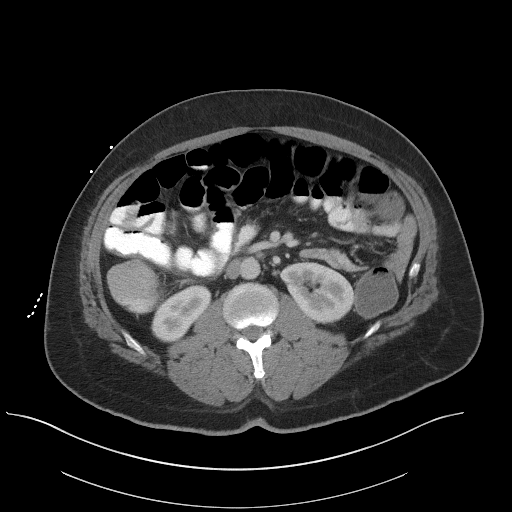
[im 75/104  soft-tissue]
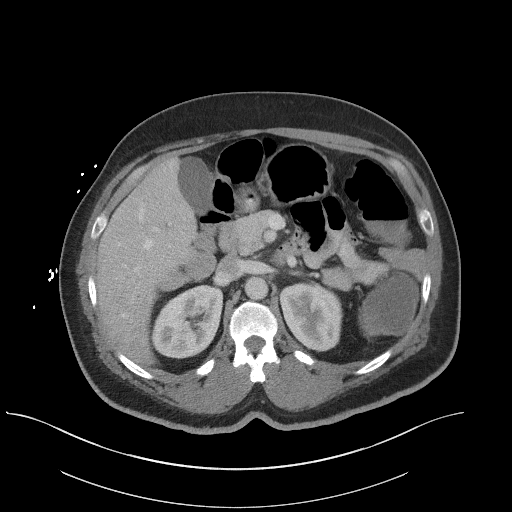
[im 75/104  bone]
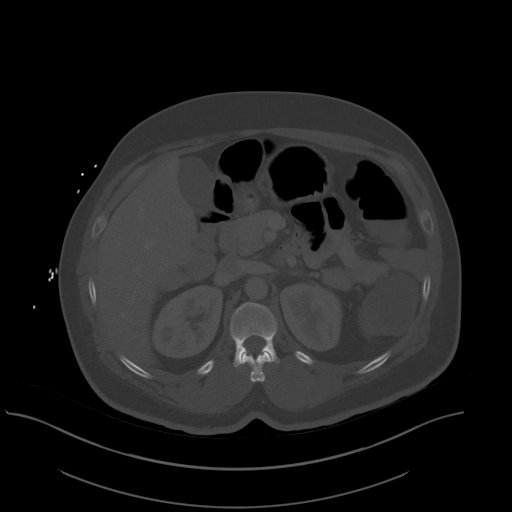
[im 81/104  soft-tissue]
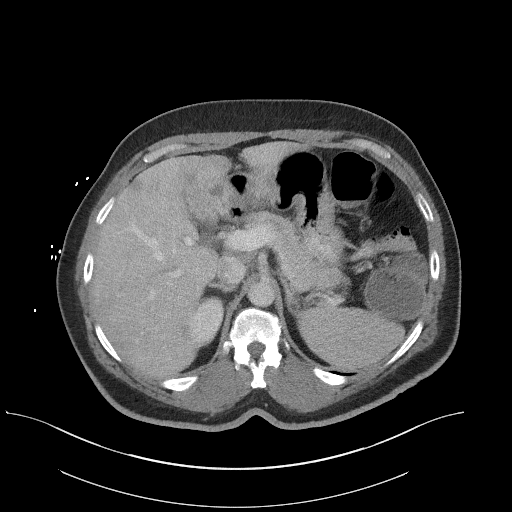
[im 86/104  soft-tissue]
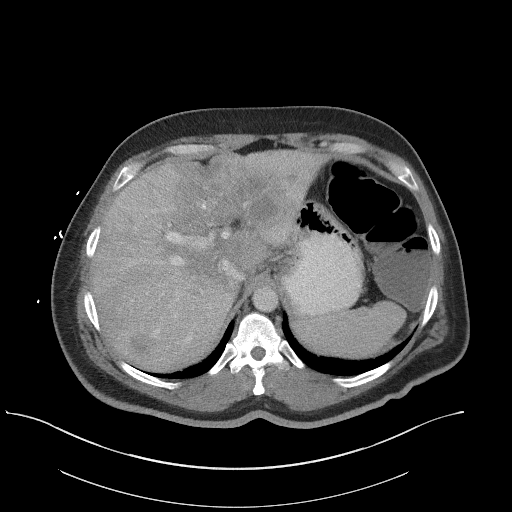
[im 98/104  soft-tissue]
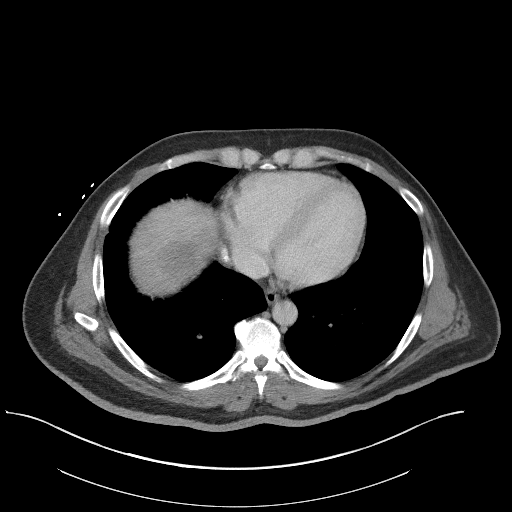

[Series 5: coronal st · coronal · 0.87mm/px · 3 of 99 slices shown]
[im 33/99  soft-tissue]
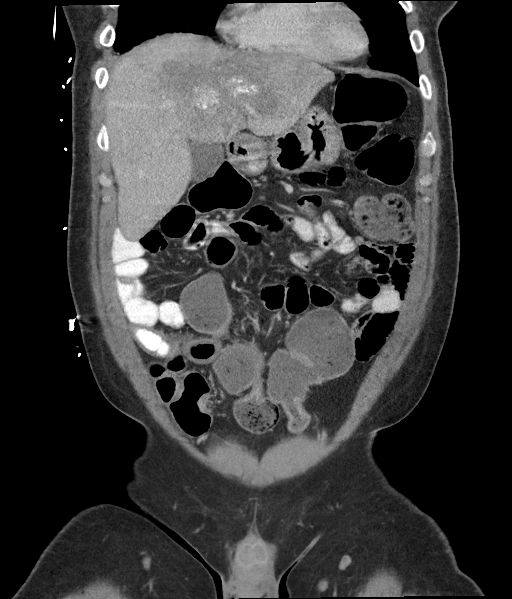
[im 44/99  soft-tissue]
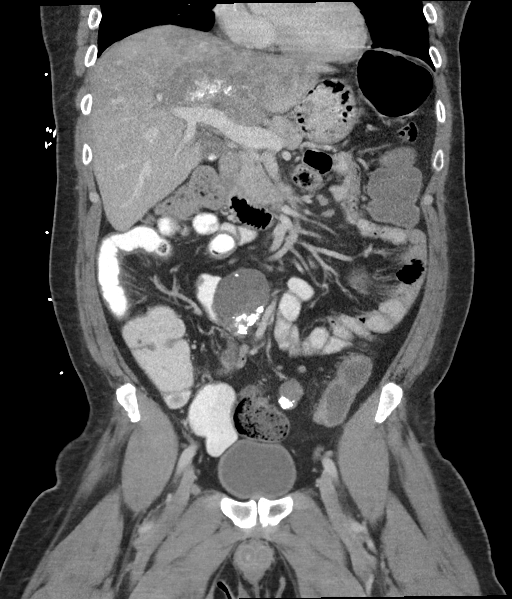
[im 55/99  soft-tissue]
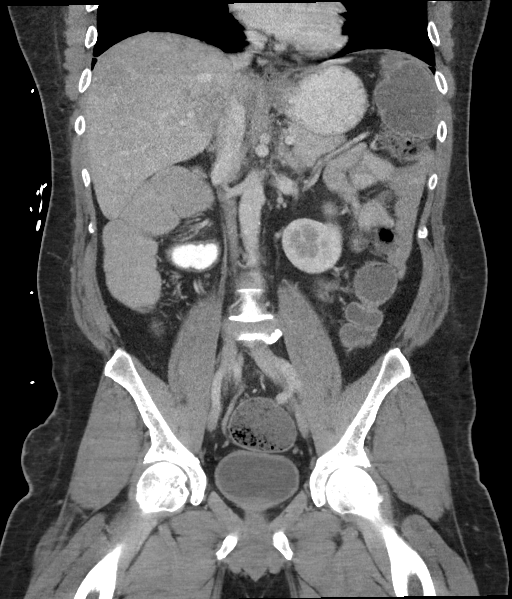

[15 of 46 positions shown; findings below may reference images not displayed]

FINDINGS: Lower chest: Right lower pulmonary nodules have resolved in the
interim. Prior small pleural effusions have resolved. Mild
subpleural atelectasis in the right middle lobe.

Hepatobiliary: Hepatic metastatic disease, with dominant lesion in
the left lobe decreased in size, currently 10.2 x 8.6 cm, previously
17 x 13 cm. There is increasing lesional central calcification and
low-density. Multiple additional low-density lesions in the liver
which appear smaller. The portal vein remains patent. Gallstones
within the gallbladder, no evidence of gallbladder inflammation. No
biliary dilatation.

Pancreas: No ductal dilatation or inflammation.

Spleen: Normal in size without focal abnormality.

Adrenals/Urinary Tract: Normal adrenal glands. No hydronephrosis or
perinephric edema. Homogeneous renal enhancement with symmetric
excretion on delayed phase imaging. Few tiny hypodensities in both
kidneys, too small to characterize but likely cysts. Urinary bladder
is physiologically distended without wall thickening.

Stomach/Bowel: Stomach physiologically distended. No small bowel
obstruction, enteric contrast reaches colon. Small bowel wall
thickening of the distal and terminal ileum spans a length of
approximately 10 cm. Minimal associated mesenteric edema. No other
small bowel wall thickening or inflammation. Normal appendix.

Known colonic neoplasm involving the sigmoid spanning approximately
5.5 cm demonstrates mild wall thickening with luminal narrowing.
Liquid stool throughout the more proximal colon consistent with
diarrheal state. Short segment area of circumferential narrowing of
the more distal sigmoid colon, image 75 series 2, likely peristalsis
but nonspecific. Formed stool in the distal colon. Formed stool in
the rectum.

Vascular/Lymphatic: Calcified left mesenteric nodes, grossly
unchanged in size allowing for differences in modality. Bulky
partially calcified low-density node in the central mesentery, image
52 series 2 is unchanged measuring 4 cm. No new adenopathy. Multiple
small central mesenteric nodes are nonspecific. Patent portal vein,
mass effect from hepatic metastasis. Mesenteric vessels are patent.
Normal caliber abdominal aorta.

Reproductive: Prominent prostate gland unchanged.

Other: Trace free fluid in the left lower quadrant without
significant ascites. No free air or perforation. Fat containing
umbilical hernia.

Musculoskeletal: There are no acute or suspicious osseous
abnormalities. No blastic or destructive lytic lesion.
IMPRESSION: 1. Moderate length segment of small bowel wall thickening of the
distal and terminal ileum, this may be infectious or inflammatory.
Liquid stool throughout the colon consistent with diarrheal process.
No obstruction.
2. Known sigmoid colonic malignancy with unchanged circumferential
wall thickening. Short segment of apparent circumferential wall
thickening of the more distal sigmoid colon favored to represent
peristalsis, but nonspecific.
3. Partially calcified left and central metastatic mesenteric nodes,
grossly unchanged from prior exam.
4. Hepatic metastatic disease, with decreased size of dominant
hepatic lesions.
5. Resolved right lower lobe pulmonary nodule, possibly metastatic.
6. Incidental findings of cholelithiasis.
